# Patient Record
Sex: Male | Born: 1935
Health system: Southern US, Community
[De-identification: ages and names within clinical notes are randomized; demographics above are authoritative.]

## PROBLEM LIST (undated history)

## (undated) DIAGNOSIS — M109 Gout, unspecified: Secondary | ICD-10-CM

## (undated) DIAGNOSIS — Z8619 Personal history of other infectious and parasitic diseases: Secondary | ICD-10-CM

## (undated) DIAGNOSIS — R972 Elevated prostate specific antigen [PSA]: Secondary | ICD-10-CM

## (undated) DIAGNOSIS — H3530 Unspecified macular degeneration: Secondary | ICD-10-CM

## (undated) DIAGNOSIS — R011 Cardiac murmur, unspecified: Secondary | ICD-10-CM

## (undated) DIAGNOSIS — Z8669 Personal history of other diseases of the nervous system and sense organs: Secondary | ICD-10-CM

## (undated) DIAGNOSIS — I10 Essential (primary) hypertension: Secondary | ICD-10-CM

## (undated) DIAGNOSIS — Z8679 Personal history of other diseases of the circulatory system: Secondary | ICD-10-CM

## (undated) HISTORY — DX: Personal history of other diseases of the nervous system and sense organs: Z86.69

## (undated) HISTORY — DX: Personal history of other infectious and parasitic diseases: Z86.19

## (undated) HISTORY — PX: CATARACT EXTRACTION: SUR2

## (undated) HISTORY — PX: TONSILLECTOMY: SHX5217

## (undated) HISTORY — PX: REPLACEMENT TOTAL KNEE BILATERAL: SUR1225

## (undated) HISTORY — DX: Elevated prostate specific antigen (PSA): R97.20

## (undated) HISTORY — DX: Personal history of other diseases of the circulatory system: Z86.79

## (undated) HISTORY — DX: Gout, unspecified: M10.9

---

## 2008-07-13 HISTORY — PX: RADIOACTIVE SEED IMPLANT: SHX5150

## 2009-09-09 ENCOUNTER — Encounter: Admission: RE | Admit: 2009-09-09 | Discharge: 2009-09-09 | Payer: Self-pay | Admitting: Urology

## 2009-10-23 ENCOUNTER — Ambulatory Visit (HOSPITAL_BASED_OUTPATIENT_CLINIC_OR_DEPARTMENT_OTHER): Admission: RE | Admit: 2009-10-23 | Discharge: 2009-10-23 | Payer: Self-pay | Admitting: Urology

## 2009-10-25 ENCOUNTER — Ambulatory Visit: Admission: RE | Admit: 2009-10-25 | Discharge: 2009-12-06 | Payer: Self-pay | Admitting: Radiation Oncology

## 2010-07-23 IMAGING — CR DG CHEST 2V
3 series · 3 of 3 positions shown · non-contrast
Comparison: None.

CLINICAL DATA: Prostate cancer.

CHEST - 2 VIEW

[view not recorded (1 of 3)]
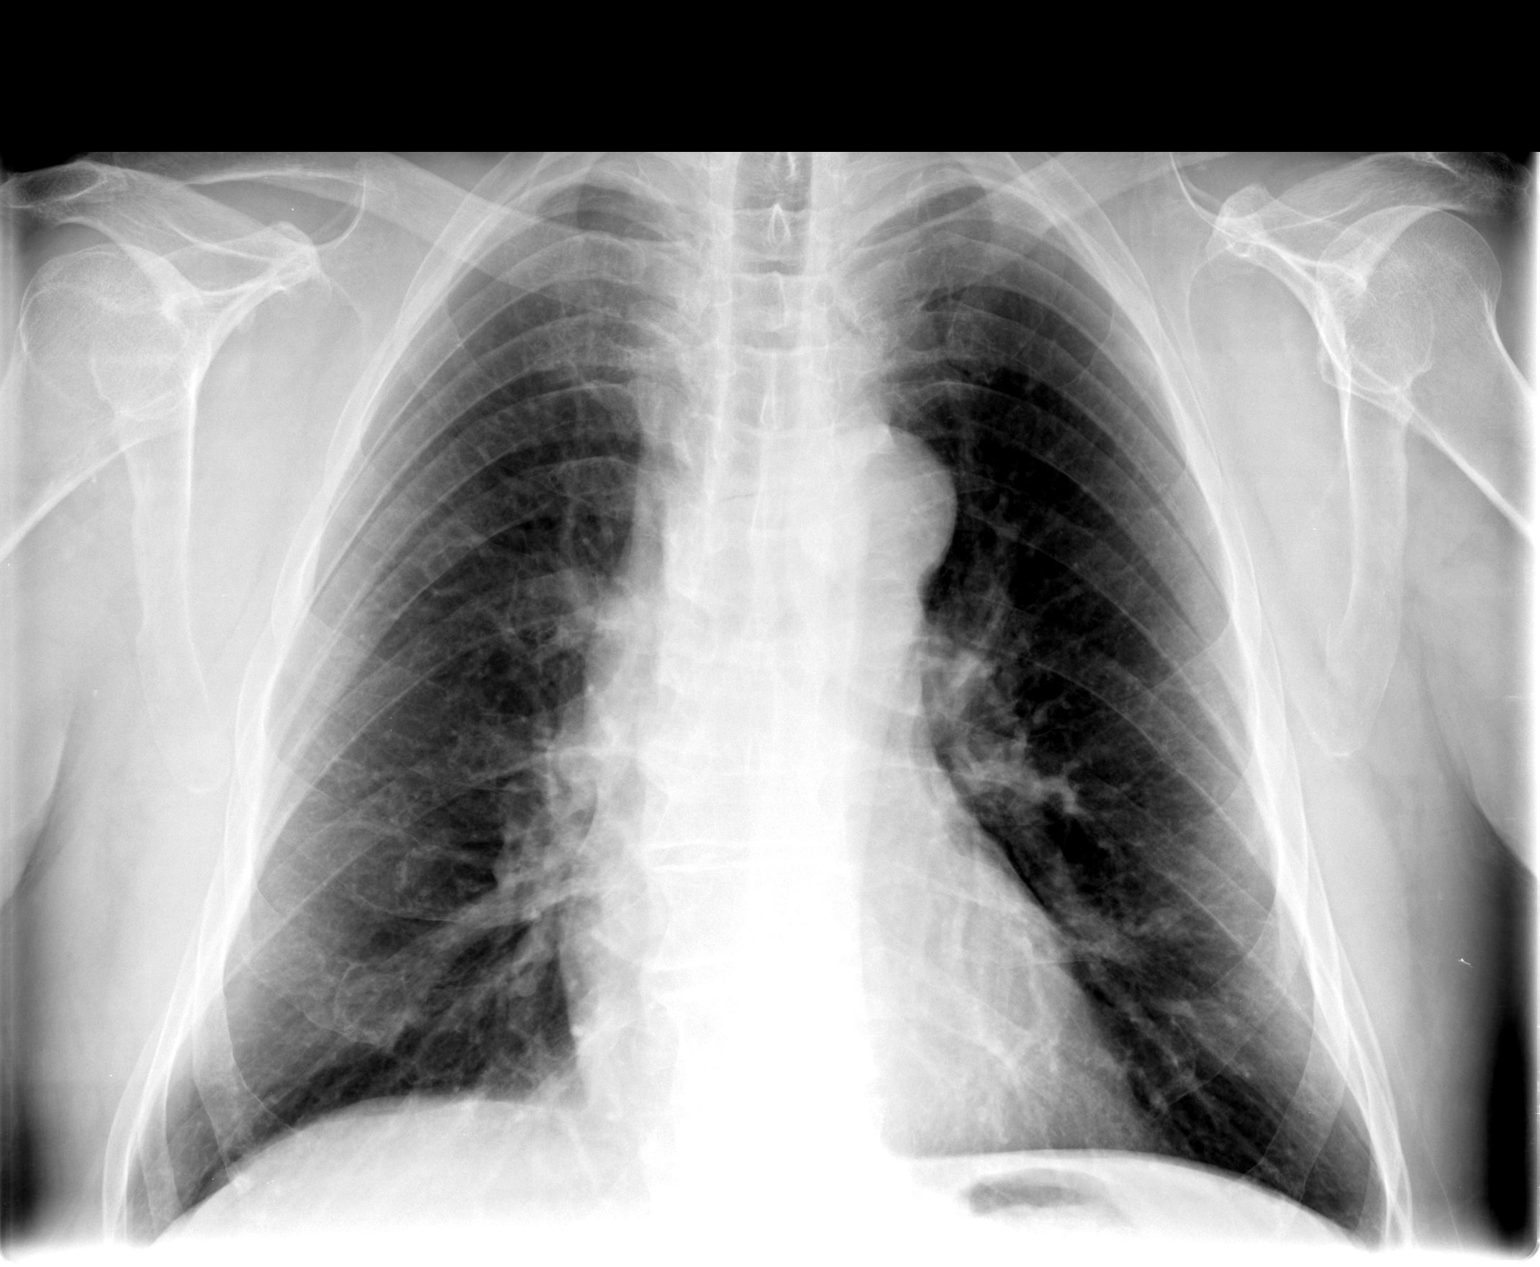

[view not recorded (2 of 3)]
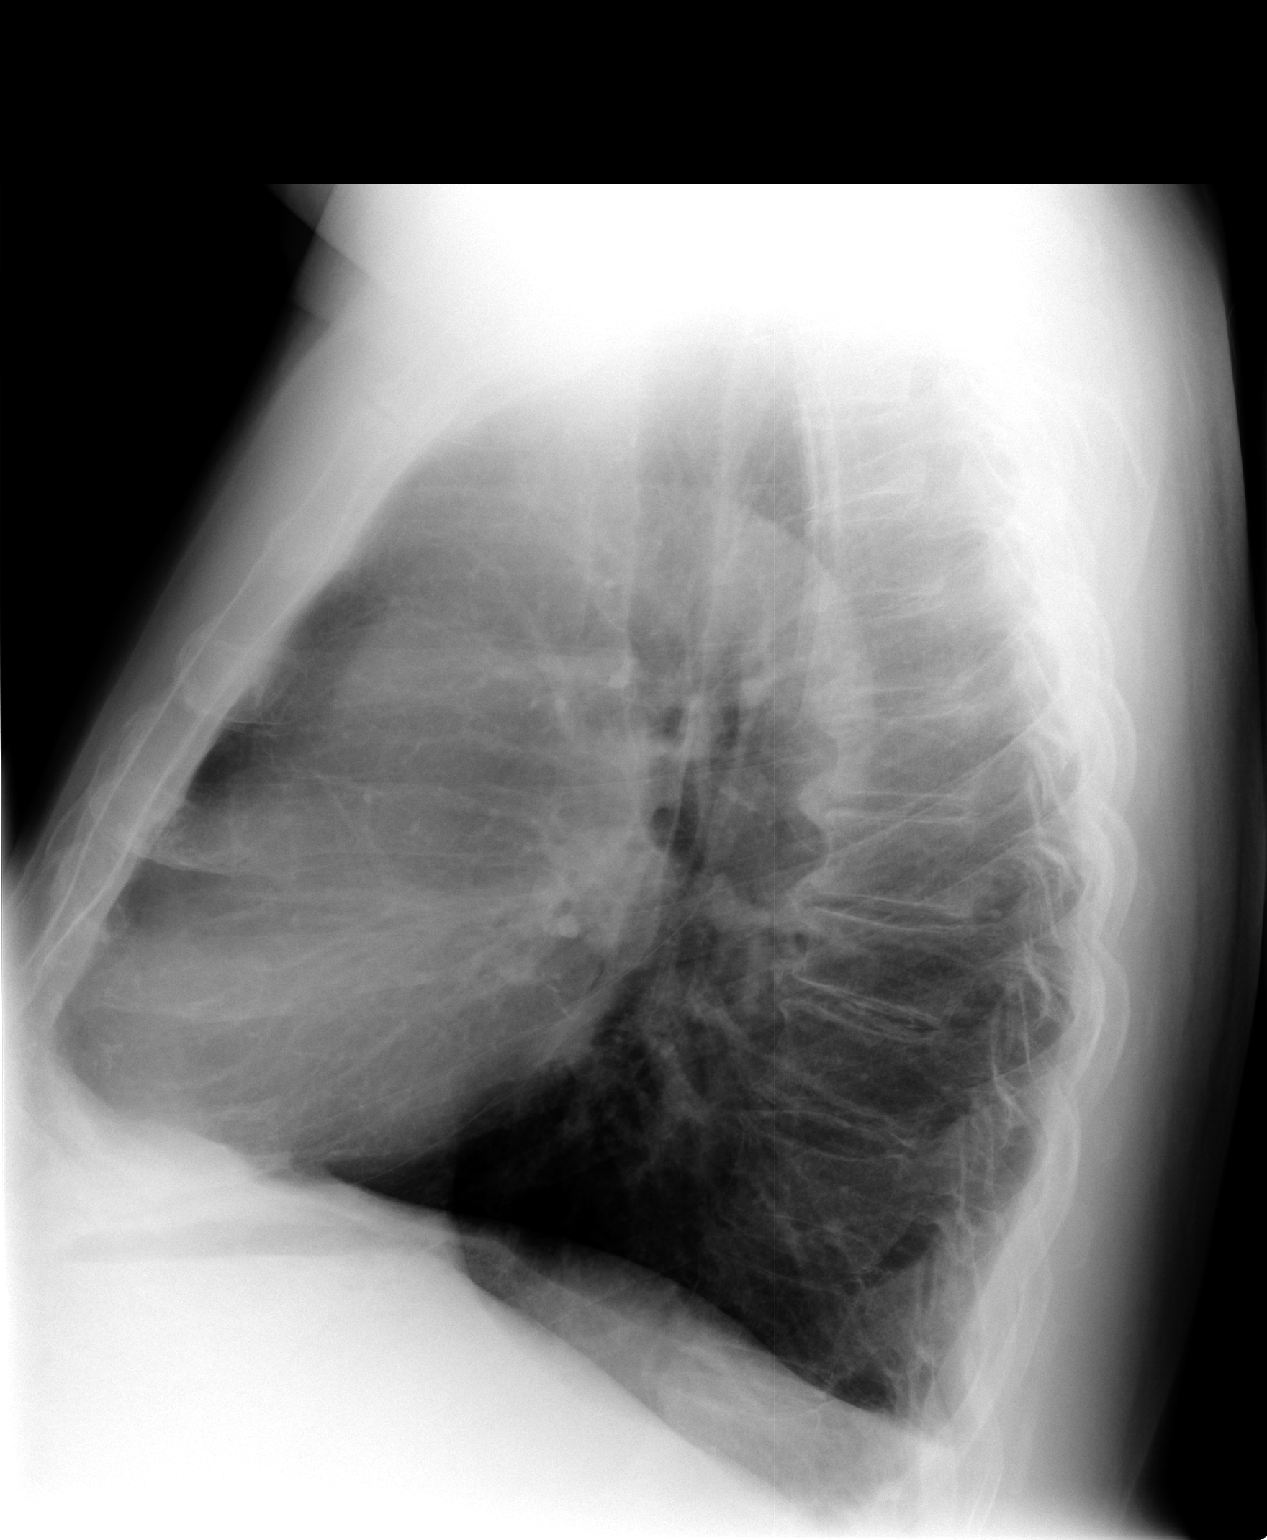

[view not recorded (3 of 3)]
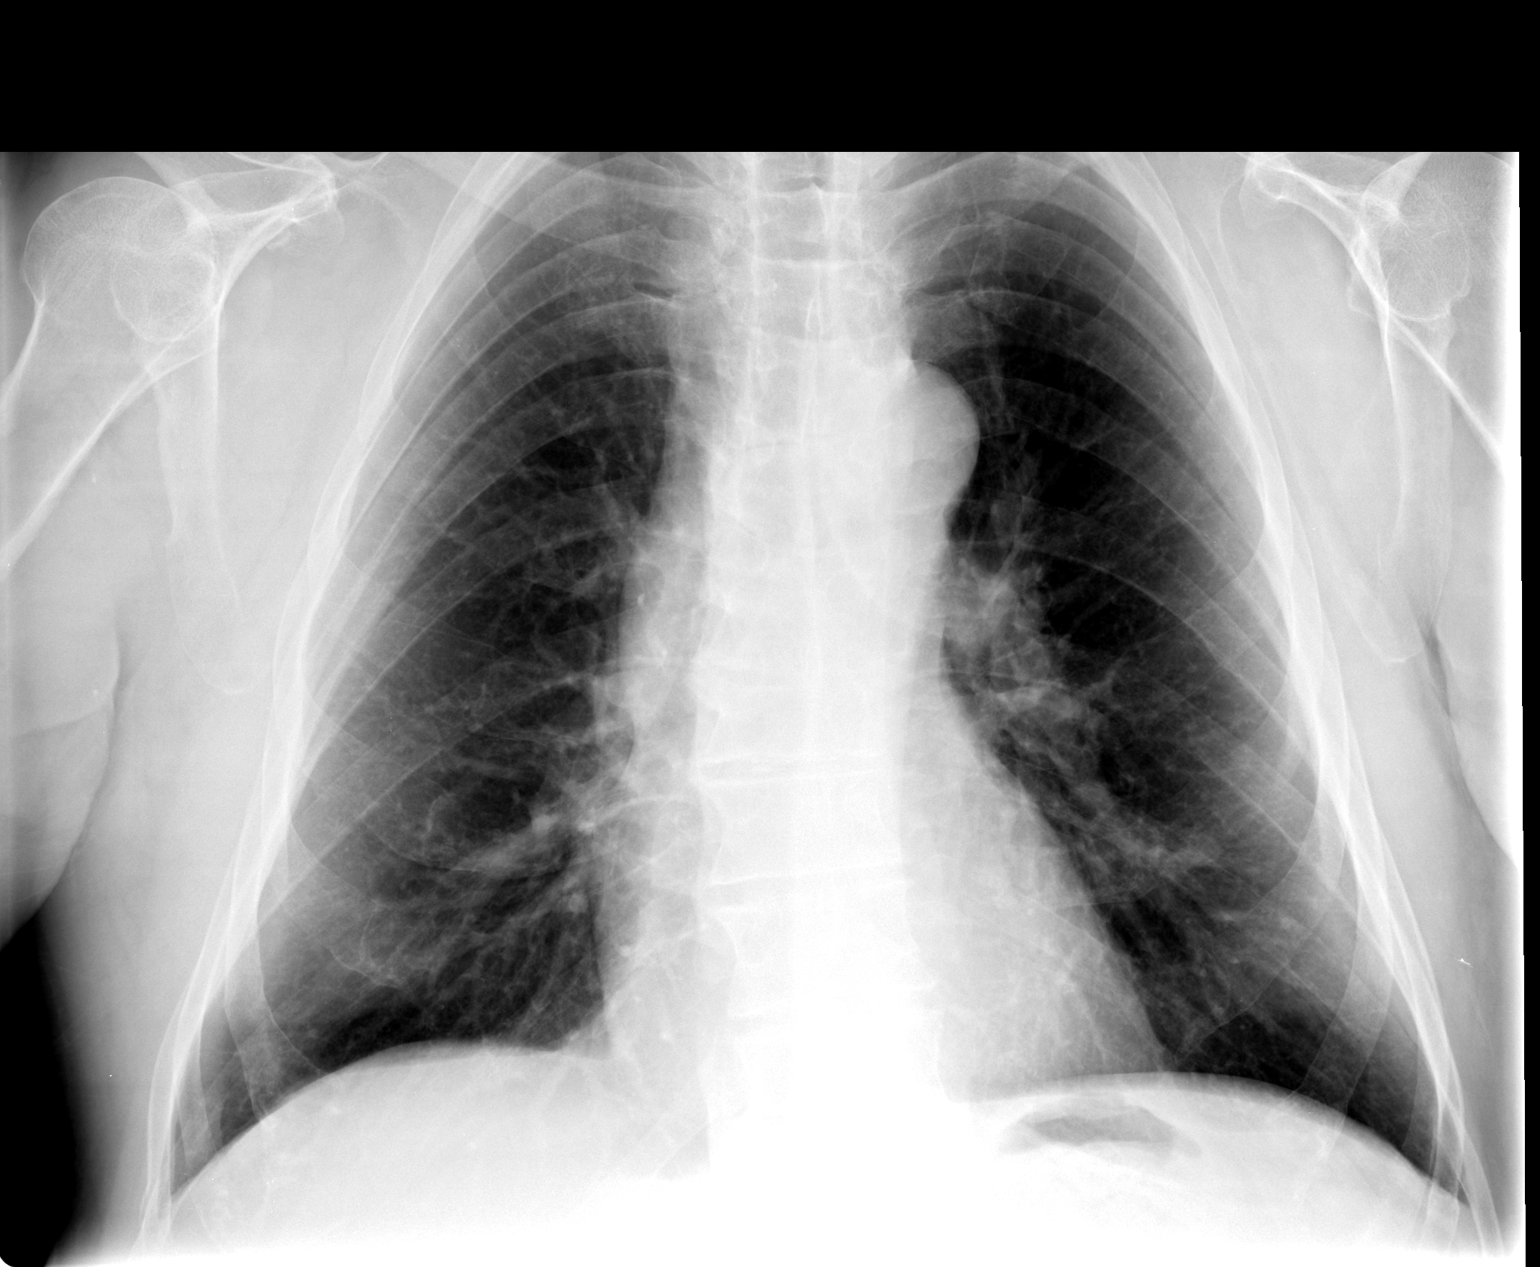

[3 of 3 positions shown; findings below may reference images not displayed]

FINDINGS: Trachea is midline.  Heart size normal.  Biapical pleural
thickening.  Lungs are clear.  Flattening of the hemidiaphragms.
No pleural fluid.  Degenerative changes in the spine.
IMPRESSION: COPD without acute finding.

## 2010-10-06 LAB — COMPREHENSIVE METABOLIC PANEL
ALT: 34 U/L (ref 0–53)
Alkaline Phosphatase: 60 U/L (ref 39–117)
BUN: 9 mg/dL (ref 6–23)
CO2: 30 mEq/L (ref 19–32)
Calcium: 9.5 mg/dL (ref 8.4–10.5)
Chloride: 103 mEq/L (ref 96–112)
Creatinine, Ser: 0.62 mg/dL (ref 0.4–1.5)
Potassium: 4.9 mEq/L (ref 3.5–5.1)
Sodium: 137 mEq/L (ref 135–145)
Total Bilirubin: 0.9 mg/dL (ref 0.3–1.2)

## 2010-10-06 LAB — CBC
MCHC: 32.7 g/dL (ref 30.0–36.0)
RDW: 14.4 % (ref 11.5–15.5)
WBC: 6.8 10*3/uL (ref 4.0–10.5)

## 2010-10-06 LAB — APTT: aPTT: 30 seconds (ref 24–37)

## 2010-10-06 LAB — PROTIME-INR
INR: 1.04 (ref 0.00–1.49)
Prothrombin Time: 13.5 seconds (ref 11.6–15.2)

## 2015-07-24 DIAGNOSIS — M1990 Unspecified osteoarthritis, unspecified site: Secondary | ICD-10-CM | POA: Insufficient documentation

## 2015-07-24 DIAGNOSIS — D1779 Benign lipomatous neoplasm of other sites: Secondary | ICD-10-CM | POA: Insufficient documentation

## 2015-07-24 DIAGNOSIS — B351 Tinea unguium: Secondary | ICD-10-CM

## 2015-07-24 DIAGNOSIS — C61 Malignant neoplasm of prostate: Secondary | ICD-10-CM | POA: Insufficient documentation

## 2015-07-24 DIAGNOSIS — D126 Benign neoplasm of colon, unspecified: Secondary | ICD-10-CM

## 2015-07-24 DIAGNOSIS — G4730 Sleep apnea, unspecified: Secondary | ICD-10-CM | POA: Insufficient documentation

## 2015-07-24 DIAGNOSIS — E6609 Other obesity due to excess calories: Secondary | ICD-10-CM | POA: Insufficient documentation

## 2015-07-24 DIAGNOSIS — H409 Unspecified glaucoma: Secondary | ICD-10-CM | POA: Insufficient documentation

## 2015-07-24 HISTORY — DX: Sleep apnea, unspecified: G47.30

## 2015-07-24 HISTORY — DX: Unspecified osteoarthritis, unspecified site: M19.90

## 2015-07-24 HISTORY — DX: Benign neoplasm of colon, unspecified: D12.6

## 2015-07-24 HISTORY — DX: Tinea unguium: B35.1

## 2015-07-24 HISTORY — DX: Malignant neoplasm of prostate: C61

## 2015-08-13 DIAGNOSIS — Z79899 Other long term (current) drug therapy: Secondary | ICD-10-CM | POA: Diagnosis not present

## 2015-08-13 DIAGNOSIS — E781 Pure hyperglyceridemia: Secondary | ICD-10-CM | POA: Diagnosis not present

## 2015-08-13 DIAGNOSIS — Z0000 Encounter for general adult medical examination without abnormal findings: Secondary | ICD-10-CM | POA: Diagnosis not present

## 2015-08-13 DIAGNOSIS — G4733 Obstructive sleep apnea (adult) (pediatric): Secondary | ICD-10-CM | POA: Diagnosis not present

## 2015-08-13 DIAGNOSIS — B351 Tinea unguium: Secondary | ICD-10-CM | POA: Diagnosis not present

## 2015-08-13 DIAGNOSIS — M1991 Primary osteoarthritis, unspecified site: Secondary | ICD-10-CM | POA: Diagnosis not present

## 2015-08-13 DIAGNOSIS — Z6831 Body mass index (BMI) 31.0-31.9, adult: Secondary | ICD-10-CM | POA: Diagnosis not present

## 2015-08-13 DIAGNOSIS — D126 Benign neoplasm of colon, unspecified: Secondary | ICD-10-CM | POA: Diagnosis not present

## 2015-08-13 DIAGNOSIS — E6609 Other obesity due to excess calories: Secondary | ICD-10-CM | POA: Diagnosis not present

## 2015-08-13 DIAGNOSIS — C61 Malignant neoplasm of prostate: Secondary | ICD-10-CM | POA: Diagnosis not present

## 2015-09-19 DIAGNOSIS — D126 Benign neoplasm of colon, unspecified: Secondary | ICD-10-CM | POA: Diagnosis not present

## 2015-09-19 DIAGNOSIS — K5900 Constipation, unspecified: Secondary | ICD-10-CM | POA: Diagnosis not present

## 2015-09-19 DIAGNOSIS — K5730 Diverticulosis of large intestine without perforation or abscess without bleeding: Secondary | ICD-10-CM | POA: Diagnosis not present

## 2015-10-08 DIAGNOSIS — Z79899 Other long term (current) drug therapy: Secondary | ICD-10-CM | POA: Diagnosis not present

## 2015-10-08 DIAGNOSIS — K5730 Diverticulosis of large intestine without perforation or abscess without bleeding: Secondary | ICD-10-CM | POA: Diagnosis not present

## 2015-10-08 DIAGNOSIS — K6389 Other specified diseases of intestine: Secondary | ICD-10-CM | POA: Diagnosis not present

## 2015-10-08 DIAGNOSIS — Z85828 Personal history of other malignant neoplasm of skin: Secondary | ICD-10-CM | POA: Diagnosis not present

## 2015-10-08 DIAGNOSIS — Z86010 Personal history of colonic polyps: Secondary | ICD-10-CM | POA: Diagnosis not present

## 2015-10-08 DIAGNOSIS — Z1211 Encounter for screening for malignant neoplasm of colon: Secondary | ICD-10-CM | POA: Diagnosis not present

## 2015-10-08 DIAGNOSIS — Z8546 Personal history of malignant neoplasm of prostate: Secondary | ICD-10-CM | POA: Diagnosis not present

## 2015-10-08 DIAGNOSIS — I10 Essential (primary) hypertension: Secondary | ICD-10-CM | POA: Diagnosis not present

## 2015-11-12 DIAGNOSIS — Z9842 Cataract extraction status, left eye: Secondary | ICD-10-CM | POA: Diagnosis not present

## 2015-11-12 DIAGNOSIS — H353131 Nonexudative age-related macular degeneration, bilateral, early dry stage: Secondary | ICD-10-CM | POA: Diagnosis not present

## 2015-11-12 DIAGNOSIS — H47233 Glaucomatous optic atrophy, bilateral: Secondary | ICD-10-CM | POA: Diagnosis not present

## 2015-11-12 DIAGNOSIS — H524 Presbyopia: Secondary | ICD-10-CM | POA: Diagnosis not present

## 2015-11-12 DIAGNOSIS — H401131 Primary open-angle glaucoma, bilateral, mild stage: Secondary | ICD-10-CM | POA: Diagnosis not present

## 2015-11-12 DIAGNOSIS — H52223 Regular astigmatism, bilateral: Secondary | ICD-10-CM | POA: Diagnosis not present

## 2015-11-12 DIAGNOSIS — Z9841 Cataract extraction status, right eye: Secondary | ICD-10-CM | POA: Diagnosis not present

## 2015-11-12 DIAGNOSIS — H5203 Hypermetropia, bilateral: Secondary | ICD-10-CM | POA: Diagnosis not present

## 2015-11-12 DIAGNOSIS — Z961 Presence of intraocular lens: Secondary | ICD-10-CM | POA: Diagnosis not present

## 2015-11-25 DIAGNOSIS — H401132 Primary open-angle glaucoma, bilateral, moderate stage: Secondary | ICD-10-CM | POA: Diagnosis not present

## 2015-11-25 DIAGNOSIS — H47233 Glaucomatous optic atrophy, bilateral: Secondary | ICD-10-CM | POA: Diagnosis not present

## 2015-12-23 DIAGNOSIS — D485 Neoplasm of uncertain behavior of skin: Secondary | ICD-10-CM | POA: Diagnosis not present

## 2015-12-23 DIAGNOSIS — L820 Inflamed seborrheic keratosis: Secondary | ICD-10-CM | POA: Diagnosis not present

## 2016-03-03 DIAGNOSIS — H401132 Primary open-angle glaucoma, bilateral, moderate stage: Secondary | ICD-10-CM | POA: Diagnosis not present

## 2016-03-03 DIAGNOSIS — H47233 Glaucomatous optic atrophy, bilateral: Secondary | ICD-10-CM | POA: Diagnosis not present

## 2016-04-13 DIAGNOSIS — Z23 Encounter for immunization: Secondary | ICD-10-CM | POA: Diagnosis not present

## 2016-06-02 DIAGNOSIS — H47233 Glaucomatous optic atrophy, bilateral: Secondary | ICD-10-CM | POA: Diagnosis not present

## 2016-06-02 DIAGNOSIS — H401132 Primary open-angle glaucoma, bilateral, moderate stage: Secondary | ICD-10-CM | POA: Diagnosis not present

## 2016-08-21 DIAGNOSIS — C61 Malignant neoplasm of prostate: Secondary | ICD-10-CM | POA: Diagnosis not present

## 2016-08-21 DIAGNOSIS — E669 Obesity, unspecified: Secondary | ICD-10-CM | POA: Diagnosis not present

## 2016-08-21 DIAGNOSIS — Z6830 Body mass index (BMI) 30.0-30.9, adult: Secondary | ICD-10-CM | POA: Diagnosis not present

## 2016-08-21 DIAGNOSIS — M1991 Primary osteoarthritis, unspecified site: Secondary | ICD-10-CM | POA: Diagnosis not present

## 2016-08-21 DIAGNOSIS — K439 Ventral hernia without obstruction or gangrene: Secondary | ICD-10-CM

## 2016-08-21 DIAGNOSIS — E781 Pure hyperglyceridemia: Secondary | ICD-10-CM | POA: Diagnosis not present

## 2016-08-21 DIAGNOSIS — D126 Benign neoplasm of colon, unspecified: Secondary | ICD-10-CM | POA: Diagnosis not present

## 2016-08-21 DIAGNOSIS — Z79899 Other long term (current) drug therapy: Secondary | ICD-10-CM | POA: Diagnosis not present

## 2016-08-21 DIAGNOSIS — G4733 Obstructive sleep apnea (adult) (pediatric): Secondary | ICD-10-CM | POA: Diagnosis not present

## 2016-08-21 DIAGNOSIS — Z0000 Encounter for general adult medical examination without abnormal findings: Secondary | ICD-10-CM | POA: Diagnosis not present

## 2016-08-21 HISTORY — DX: Ventral hernia without obstruction or gangrene: K43.9

## 2016-09-03 DIAGNOSIS — H401132 Primary open-angle glaucoma, bilateral, moderate stage: Secondary | ICD-10-CM | POA: Diagnosis not present

## 2016-09-03 DIAGNOSIS — H353131 Nonexudative age-related macular degeneration, bilateral, early dry stage: Secondary | ICD-10-CM | POA: Diagnosis not present

## 2016-09-03 DIAGNOSIS — H47233 Glaucomatous optic atrophy, bilateral: Secondary | ICD-10-CM | POA: Diagnosis not present

## 2016-09-15 DIAGNOSIS — N529 Male erectile dysfunction, unspecified: Secondary | ICD-10-CM | POA: Diagnosis not present

## 2016-09-15 DIAGNOSIS — C61 Malignant neoplasm of prostate: Secondary | ICD-10-CM | POA: Diagnosis not present

## 2017-01-05 DIAGNOSIS — H353131 Nonexudative age-related macular degeneration, bilateral, early dry stage: Secondary | ICD-10-CM | POA: Diagnosis not present

## 2017-01-05 DIAGNOSIS — Z9842 Cataract extraction status, left eye: Secondary | ICD-10-CM | POA: Diagnosis not present

## 2017-01-05 DIAGNOSIS — H52223 Regular astigmatism, bilateral: Secondary | ICD-10-CM | POA: Diagnosis not present

## 2017-01-05 DIAGNOSIS — Z961 Presence of intraocular lens: Secondary | ICD-10-CM | POA: Diagnosis not present

## 2017-01-05 DIAGNOSIS — H401132 Primary open-angle glaucoma, bilateral, moderate stage: Secondary | ICD-10-CM | POA: Diagnosis not present

## 2017-01-05 DIAGNOSIS — H524 Presbyopia: Secondary | ICD-10-CM | POA: Diagnosis not present

## 2017-01-05 DIAGNOSIS — H5203 Hypermetropia, bilateral: Secondary | ICD-10-CM | POA: Diagnosis not present

## 2017-01-05 DIAGNOSIS — H47233 Glaucomatous optic atrophy, bilateral: Secondary | ICD-10-CM | POA: Diagnosis not present

## 2017-01-05 DIAGNOSIS — Z9841 Cataract extraction status, right eye: Secondary | ICD-10-CM | POA: Diagnosis not present

## 2017-03-25 DIAGNOSIS — C61 Malignant neoplasm of prostate: Secondary | ICD-10-CM | POA: Diagnosis not present

## 2017-03-25 DIAGNOSIS — Z7689 Persons encountering health services in other specified circumstances: Secondary | ICD-10-CM | POA: Diagnosis not present

## 2017-04-06 DIAGNOSIS — H52223 Regular astigmatism, bilateral: Secondary | ICD-10-CM | POA: Diagnosis not present

## 2017-04-06 DIAGNOSIS — H524 Presbyopia: Secondary | ICD-10-CM | POA: Diagnosis not present

## 2017-04-06 DIAGNOSIS — Z23 Encounter for immunization: Secondary | ICD-10-CM | POA: Diagnosis not present

## 2017-04-06 DIAGNOSIS — H47233 Glaucomatous optic atrophy, bilateral: Secondary | ICD-10-CM | POA: Diagnosis not present

## 2017-04-06 DIAGNOSIS — H5203 Hypermetropia, bilateral: Secondary | ICD-10-CM | POA: Diagnosis not present

## 2017-04-06 DIAGNOSIS — H401132 Primary open-angle glaucoma, bilateral, moderate stage: Secondary | ICD-10-CM | POA: Diagnosis not present

## 2017-09-23 DIAGNOSIS — H409 Unspecified glaucoma: Secondary | ICD-10-CM | POA: Diagnosis not present

## 2017-09-23 DIAGNOSIS — M1991 Primary osteoarthritis, unspecified site: Secondary | ICD-10-CM | POA: Diagnosis not present

## 2017-09-23 DIAGNOSIS — E781 Pure hyperglyceridemia: Secondary | ICD-10-CM | POA: Diagnosis not present

## 2017-09-23 DIAGNOSIS — R351 Nocturia: Secondary | ICD-10-CM | POA: Diagnosis not present

## 2017-09-23 DIAGNOSIS — Z86010 Personal history of colonic polyps: Secondary | ICD-10-CM | POA: Diagnosis not present

## 2017-09-23 DIAGNOSIS — C61 Malignant neoplasm of prostate: Secondary | ICD-10-CM | POA: Diagnosis not present

## 2017-09-23 DIAGNOSIS — Z0000 Encounter for general adult medical examination without abnormal findings: Secondary | ICD-10-CM | POA: Diagnosis not present

## 2017-10-21 DIAGNOSIS — D485 Neoplasm of uncertain behavior of skin: Secondary | ICD-10-CM | POA: Diagnosis not present

## 2017-10-21 DIAGNOSIS — L821 Other seborrheic keratosis: Secondary | ICD-10-CM | POA: Diagnosis not present

## 2017-10-21 DIAGNOSIS — D225 Melanocytic nevi of trunk: Secondary | ICD-10-CM | POA: Diagnosis not present

## 2017-10-21 DIAGNOSIS — D1801 Hemangioma of skin and subcutaneous tissue: Secondary | ICD-10-CM | POA: Diagnosis not present

## 2018-02-04 DIAGNOSIS — H401131 Primary open-angle glaucoma, bilateral, mild stage: Secondary | ICD-10-CM | POA: Diagnosis not present

## 2018-03-29 DIAGNOSIS — C61 Malignant neoplasm of prostate: Secondary | ICD-10-CM | POA: Diagnosis not present

## 2018-03-29 DIAGNOSIS — Z7689 Persons encountering health services in other specified circumstances: Secondary | ICD-10-CM | POA: Diagnosis not present

## 2018-03-29 DIAGNOSIS — R351 Nocturia: Secondary | ICD-10-CM | POA: Diagnosis not present

## 2018-04-08 DIAGNOSIS — Z23 Encounter for immunization: Secondary | ICD-10-CM | POA: Diagnosis not present

## 2018-09-13 DIAGNOSIS — H40013 Open angle with borderline findings, low risk, bilateral: Secondary | ICD-10-CM | POA: Diagnosis not present

## 2018-09-26 DIAGNOSIS — C61 Malignant neoplasm of prostate: Secondary | ICD-10-CM | POA: Diagnosis not present

## 2018-09-26 DIAGNOSIS — G4733 Obstructive sleep apnea (adult) (pediatric): Secondary | ICD-10-CM | POA: Diagnosis not present

## 2018-09-26 DIAGNOSIS — Z79899 Other long term (current) drug therapy: Secondary | ICD-10-CM | POA: Diagnosis not present

## 2018-09-26 DIAGNOSIS — M1991 Primary osteoarthritis, unspecified site: Secondary | ICD-10-CM | POA: Diagnosis not present

## 2018-09-26 DIAGNOSIS — Z6831 Body mass index (BMI) 31.0-31.9, adult: Secondary | ICD-10-CM | POA: Diagnosis not present

## 2018-09-26 DIAGNOSIS — E6609 Other obesity due to excess calories: Secondary | ICD-10-CM | POA: Diagnosis not present

## 2018-09-26 DIAGNOSIS — D126 Benign neoplasm of colon, unspecified: Secondary | ICD-10-CM | POA: Diagnosis not present

## 2018-09-26 DIAGNOSIS — R030 Elevated blood-pressure reading, without diagnosis of hypertension: Secondary | ICD-10-CM | POA: Diagnosis not present

## 2018-09-26 DIAGNOSIS — Z0000 Encounter for general adult medical examination without abnormal findings: Secondary | ICD-10-CM | POA: Diagnosis not present

## 2018-09-27 DIAGNOSIS — C61 Malignant neoplasm of prostate: Secondary | ICD-10-CM | POA: Diagnosis not present

## 2018-09-27 DIAGNOSIS — R351 Nocturia: Secondary | ICD-10-CM | POA: Diagnosis not present

## 2018-10-27 DIAGNOSIS — L820 Inflamed seborrheic keratosis: Secondary | ICD-10-CM | POA: Diagnosis not present

## 2018-10-27 DIAGNOSIS — D1801 Hemangioma of skin and subcutaneous tissue: Secondary | ICD-10-CM | POA: Diagnosis not present

## 2018-10-27 DIAGNOSIS — L821 Other seborrheic keratosis: Secondary | ICD-10-CM | POA: Diagnosis not present

## 2018-10-27 DIAGNOSIS — L918 Other hypertrophic disorders of the skin: Secondary | ICD-10-CM | POA: Diagnosis not present

## 2019-03-14 DIAGNOSIS — H40013 Open angle with borderline findings, low risk, bilateral: Secondary | ICD-10-CM | POA: Diagnosis not present

## 2019-04-06 DIAGNOSIS — Z23 Encounter for immunization: Secondary | ICD-10-CM | POA: Diagnosis not present

## 2019-04-06 DIAGNOSIS — C61 Malignant neoplasm of prostate: Secondary | ICD-10-CM | POA: Diagnosis not present

## 2019-04-15 DIAGNOSIS — L814 Other melanin hyperpigmentation: Secondary | ICD-10-CM | POA: Diagnosis not present

## 2019-04-15 DIAGNOSIS — D1801 Hemangioma of skin and subcutaneous tissue: Secondary | ICD-10-CM | POA: Diagnosis not present

## 2019-04-15 DIAGNOSIS — L570 Actinic keratosis: Secondary | ICD-10-CM | POA: Diagnosis not present

## 2019-04-15 DIAGNOSIS — L821 Other seborrheic keratosis: Secondary | ICD-10-CM | POA: Diagnosis not present

## 2019-05-22 DIAGNOSIS — R1032 Left lower quadrant pain: Secondary | ICD-10-CM | POA: Diagnosis not present

## 2019-05-22 DIAGNOSIS — C61 Malignant neoplasm of prostate: Secondary | ICD-10-CM | POA: Diagnosis not present

## 2019-05-22 DIAGNOSIS — R109 Unspecified abdominal pain: Secondary | ICD-10-CM | POA: Diagnosis not present

## 2019-05-23 DIAGNOSIS — R1032 Left lower quadrant pain: Secondary | ICD-10-CM | POA: Diagnosis not present

## 2019-05-23 DIAGNOSIS — S32030A Wedge compression fracture of third lumbar vertebra, initial encounter for closed fracture: Secondary | ICD-10-CM | POA: Diagnosis not present

## 2019-05-23 DIAGNOSIS — R109 Unspecified abdominal pain: Secondary | ICD-10-CM | POA: Diagnosis not present

## 2019-05-24 DIAGNOSIS — S32030D Wedge compression fracture of third lumbar vertebra, subsequent encounter for fracture with routine healing: Secondary | ICD-10-CM

## 2019-05-24 HISTORY — DX: Wedge compression fracture of third lumbar vertebra, subsequent encounter for fracture with routine healing: S32.030D

## 2019-09-28 DIAGNOSIS — R030 Elevated blood-pressure reading, without diagnosis of hypertension: Secondary | ICD-10-CM | POA: Diagnosis not present

## 2019-09-28 DIAGNOSIS — C61 Malignant neoplasm of prostate: Secondary | ICD-10-CM | POA: Diagnosis not present

## 2019-09-28 DIAGNOSIS — R7303 Prediabetes: Secondary | ICD-10-CM | POA: Diagnosis not present

## 2019-09-28 DIAGNOSIS — Z0000 Encounter for general adult medical examination without abnormal findings: Secondary | ICD-10-CM | POA: Diagnosis not present

## 2019-09-28 DIAGNOSIS — G4733 Obstructive sleep apnea (adult) (pediatric): Secondary | ICD-10-CM | POA: Diagnosis not present

## 2019-09-28 DIAGNOSIS — E782 Mixed hyperlipidemia: Secondary | ICD-10-CM

## 2019-09-28 DIAGNOSIS — S32030D Wedge compression fracture of third lumbar vertebra, subsequent encounter for fracture with routine healing: Secondary | ICD-10-CM | POA: Diagnosis not present

## 2019-09-28 DIAGNOSIS — M1991 Primary osteoarthritis, unspecified site: Secondary | ICD-10-CM | POA: Diagnosis not present

## 2019-09-28 DIAGNOSIS — E6609 Other obesity due to excess calories: Secondary | ICD-10-CM | POA: Diagnosis not present

## 2019-09-28 DIAGNOSIS — D126 Benign neoplasm of colon, unspecified: Secondary | ICD-10-CM | POA: Diagnosis not present

## 2019-09-28 DIAGNOSIS — Z6830 Body mass index (BMI) 30.0-30.9, adult: Secondary | ICD-10-CM | POA: Diagnosis not present

## 2019-09-28 HISTORY — DX: Mixed hyperlipidemia: E78.2

## 2019-09-29 DIAGNOSIS — H40013 Open angle with borderline findings, low risk, bilateral: Secondary | ICD-10-CM | POA: Diagnosis not present

## 2019-10-05 DIAGNOSIS — C61 Malignant neoplasm of prostate: Secondary | ICD-10-CM | POA: Diagnosis not present

## 2019-10-05 DIAGNOSIS — R351 Nocturia: Secondary | ICD-10-CM | POA: Diagnosis not present

## 2020-01-25 DIAGNOSIS — D225 Melanocytic nevi of trunk: Secondary | ICD-10-CM | POA: Diagnosis not present

## 2020-01-25 DIAGNOSIS — D1801 Hemangioma of skin and subcutaneous tissue: Secondary | ICD-10-CM | POA: Diagnosis not present

## 2020-01-25 DIAGNOSIS — L570 Actinic keratosis: Secondary | ICD-10-CM | POA: Diagnosis not present

## 2020-02-14 DIAGNOSIS — R030 Elevated blood-pressure reading, without diagnosis of hypertension: Secondary | ICD-10-CM | POA: Diagnosis not present

## 2020-02-14 DIAGNOSIS — M1991 Primary osteoarthritis, unspecified site: Secondary | ICD-10-CM | POA: Diagnosis not present

## 2020-02-27 DIAGNOSIS — M1712 Unilateral primary osteoarthritis, left knee: Secondary | ICD-10-CM | POA: Diagnosis not present

## 2020-02-27 DIAGNOSIS — M25462 Effusion, left knee: Secondary | ICD-10-CM | POA: Diagnosis not present

## 2020-02-27 DIAGNOSIS — W19XXXA Unspecified fall, initial encounter: Secondary | ICD-10-CM | POA: Diagnosis not present

## 2020-02-27 DIAGNOSIS — Z96652 Presence of left artificial knee joint: Secondary | ICD-10-CM | POA: Diagnosis not present

## 2020-02-27 DIAGNOSIS — Z96642 Presence of left artificial hip joint: Secondary | ICD-10-CM | POA: Diagnosis not present

## 2020-02-27 DIAGNOSIS — R102 Pelvic and perineal pain: Secondary | ICD-10-CM | POA: Diagnosis not present

## 2020-02-27 DIAGNOSIS — M25552 Pain in left hip: Secondary | ICD-10-CM | POA: Diagnosis not present

## 2020-02-27 DIAGNOSIS — Z01818 Encounter for other preprocedural examination: Secondary | ICD-10-CM | POA: Diagnosis not present

## 2020-02-27 DIAGNOSIS — R9431 Abnormal electrocardiogram [ECG] [EKG]: Secondary | ICD-10-CM | POA: Diagnosis not present

## 2020-02-27 DIAGNOSIS — R030 Elevated blood-pressure reading, without diagnosis of hypertension: Secondary | ICD-10-CM | POA: Diagnosis not present

## 2020-03-08 ENCOUNTER — Other Ambulatory Visit: Payer: Self-pay

## 2020-03-08 DIAGNOSIS — Z8619 Personal history of other infectious and parasitic diseases: Secondary | ICD-10-CM | POA: Insufficient documentation

## 2020-03-08 DIAGNOSIS — Z8669 Personal history of other diseases of the nervous system and sense organs: Secondary | ICD-10-CM | POA: Insufficient documentation

## 2020-03-08 DIAGNOSIS — Z8679 Personal history of other diseases of the circulatory system: Secondary | ICD-10-CM | POA: Insufficient documentation

## 2020-03-08 DIAGNOSIS — M109 Gout, unspecified: Secondary | ICD-10-CM | POA: Insufficient documentation

## 2020-03-08 DIAGNOSIS — R972 Elevated prostate specific antigen [PSA]: Secondary | ICD-10-CM | POA: Insufficient documentation

## 2020-03-10 NOTE — Progress Notes (Signed)
Cardiology Office Note:    Date:  03/11/2020   ID:  Austin Ayala, DOB [birth year], MRN [id number]  PCP:  Raina Mina., MD  Cardiologist:  Shirlee More, MD   Referring MD: Raina Mina., MD  ASSESSMENT:    1. Preoperative cardiovascular examination   2. Elevated blood pressure reading   3. Nonspecific abnormal electrocardiogram (ECG) (EKG)    PLAN:    In order of problems listed above:  1. His planned procedure is intermediate risk.  Patient's exercise tolerance is preserved his EKG is a pattern of LVH and repolarization.  At this time I do not think he requires preoperative stress testing and in general revascularization these individuals does not improve the operative outcome.  I would like him to start antihypertensive therapy I placed him on Cozaar in preparation for surgery and need to go a monitored bed postoperatively EKG postoperative day 1 and if any problems please call heart care to come and see him. 2. My opinion he has established hypertension with LVH and repolarization on EKG we will start antihypertensive therapy.  Next appointment as needed   Medication Adjustments/Labs and Tests Ordered: Current medicines are reviewed at length with the patient today.  Concerns regarding medicines are outlined above.  Orders Placed This Encounter  Procedures  . EKG 12-Lead   Meds ordered this encounter  Medications  . losartan (COZAAR) 50 MG tablet    Sig: Take 1 tablet (50 mg total) by mouth daily.    Dispense:  90 tablet    Refill:  3     No chief complaint on file.   History of Present Illness:    Austin Ayala is a 85 y.o. male who is being seen today for the evaluation of abnormal EKG in the preoperative setting prior to left total knee arthroplasty at the request of Raina Mina., MD.  I am unable to visually review the EKG in Care Everywhere the report shows that the rhythm was sinus bradycardia 59 bpm described as nonspecific ST-T changes and unchanged  from 02/27/2020.  Outpatient labs 09/28/2019 [facility] showed TSH of 0.00 CMP was normal except for glucose of 105 renal function was normal GFR 85 cc liver function normal.  TSH normal 3.84 and lipid profile showed a total cholesterol 191 LDL 133 triglyceride 186 HDL 34.  He reminds me I had seen him in 2013 for the same reason nonspecific EKG changes.  Sounds like he had an echocardiogram done and was cleared for surgery.  He has no history of coronary artery disease.  Despite his knee pain with previous left total knee arthroplasty [facility] and exercises on a treadmill bike works in his garden his exercise tolerance exceeds 5 Mets.  He has no history of congenital coronary rheumatic heart disease no chest pain palpitation edema or shortness of breath.  In my office today is hypertensive and rechecked by me left upper extremity 172/70.  In retrospect his EKG showed the beginning of repolarization changes he has hypertension I am going to start him on a low-dose of an ARB in preparation for surgery.  Past Medical History:  Diagnosis Date  . Benign neoplasm of colon 07/24/2015   Formatting of this note might be different from the original. Colon 2014. Stopping.  . Closed wedge compression fracture of L3 vertebra with routine healing 05/24/2019   Formatting of this note might be different from the original. Per pt message 05/24/19  .  Elevated prostate specific antigen (PSA)   . Gout, arthropathy   . H/O carpal tunnel syndrome   . H/O herpes zoster   . H/O: hypertension   . Malignant neoplasm prostate ([place]) 07/24/2015   Formatting of this note might be different from the original. Dr. Nila Nephew managing.  Twice a year.  . Mixed hyperlipidemia 09/28/2019  . Onychomycosis 07/24/2015  . Osteoarthritis 07/24/2015  . Sleep apnea 07/24/2015   Formatting of this note might be different from the original. Infrequent CPAP  . Ventral hernia without obstruction or gangrene 08/21/2016    Formatting of this note might be different from the original. Diastasis recti.    Past Surgical History:  Procedure Laterality Date  . RADIOACTIVE SEED IMPLANT  2010  . REPLACEMENT TOTAL KNEE BILATERAL    . TONSILLECTOMY      Current Medications: Current Meds  Medication Sig  . ascorbic acid (VITAMIN C) 500 MG tablet Take 1 tablet by mouth daily.  Marland Kitchen b complex vitamins capsule Take 1 capsule by mouth daily.  . calcium-vitamin D (OSCAL WITH D) 500-200 MG-UNIT TABS tablet Take 1 tablet by mouth 2 (two) times daily.  . Cholecalciferol 25 MCG (1000 UT) tablet Take 1 tablet by mouth daily.  . Cinnamon 500 MG capsule Take 1 capsule by mouth at bedtime.  . Copper Gluconate (COPPER CAPS) 2 MG CAPS Take 1 capsule by mouth at bedtime.  . Garlic 638 MG CAPS Take 1 capsule by mouth daily.  . Ginkgo Biloba 40 MG TABS Take 1 tablet by mouth daily.  . Lecithin 1200 MG CAPS Take 1 capsule by mouth daily.  . Lutein 20 MG CAPS Take 2 capsules by mouth daily.  . Milk Thistle 500 MG CAPS Take 1 capsule by mouth daily.  . Multiple Vitamin (MULTIVITAMIN) capsule Take 1 capsule by mouth daily.  . Multiple Vitamins-Minerals (PRESERVISION AREDS 2+MULTI VIT PO) Take 1 tablet by mouth at bedtime.  . Omega-3 Fatty Acids (OMEGA-3 FISH OIL) 500 MG CAPS Take 2 capsules by mouth daily.  . Potassium Gluconate 2.5 MEQ TABS Take 1 tablet by mouth daily as needed.  . vitamin E 180 MG (400 UNITS) capsule Take 1 capsule by mouth daily.     Allergies:   Doxycycline hyclate   Social History   Socioeconomic History  . Marital status: Married    Spouse name: Not on file  . Number of children: Not on file  . Years of education: Not on file  . Highest education level: Not on file  Occupational History  . Not on file  Tobacco Use  . Smoking status: Never Smoker  . Smokeless tobacco: Never Used  Vaping Use  . Vaping Use: Never used  Substance and Sexual Activity  . Alcohol use: Never  . Drug use: Never  . Sexual  activity: Not on file  Other Topics Concern  . Not on file  Social History Narrative  . Not on file   Social Determinants of Health   Financial Resource Strain:   . Difficulty of Paying Living Expenses: Not on file  Food Insecurity:   . Worried About Charity fundraiser in the Last Year: Not on file  . Ran Out of Food in the Last Year: Not on file  Transportation Needs:   . Lack of Transportation (Medical): Not on file  . Lack of Transportation (Non-Medical): Not on file  Physical Activity:   . Days of Exercise per Week: Not on file  . Minutes of Exercise  per Session: Not on file  Stress:   . Feeling of Stress : Not on file  Social Connections:   . Frequency of Communication with Friends and Family: Not on file  . Frequency of Social Gatherings with Friends and Family: Not on file  . Attends Religious Services: Not on file  . Active Member of Clubs or Organizations: Not on file  . Attends Archivist Meetings: Not on file  . Marital Status: Not on file     Family History: The patient's family history includes Alzheimer's disease in his brother; Heart disease in his brother and brother; Heart failure in his mother; Hypertension in his mother; Melanoma in his brother and brother.  ROS:   ROS Please see the history of present illness.     All other systems reviewed and are negative.  EKGs/Labs/Other Studies Reviewed:    The following studies were reviewed today:   EKG:  EKG is  ordered today.  The ekg ordered today is personally reviewed and demonstrates sinus rhythm left ventricular hypertrophy with repolarization.  He handcarried an EKG I interpreted 2013 with similar but less pronounced pattern.    Physical Exam:    VS:  BP (!) 154/68   Pulse 63   Ht 6' (1.829 m)   Wt 223 lb 9.6 oz (101.4 kg)   SpO2 94%   BMI 30.33 kg/m     Wt Readings from Last 3 Encounters:  03/11/20 223 lb 9.6 oz (101.4 kg)     GEN:  Well nourished, well developed in no acute  distress HEENT: Normal NECK: No JVD; No carotid bruits LYMPHATICS: No lymphadenopathy CARDIAC: RRR, no murmurs, rubs, gallops RESPIRATORY:  Clear to auscultation without rales, wheezing or rhonchi  ABDOMEN: Soft, non-tender, non-distended MUSCULOSKELETAL:  No edema; No deformity  SKIN: Warm and dry NEUROLOGIC:  Alert and oriented x 3 PSYCHIATRIC:  Normal affect     Signed, Shirlee More, MD  03/11/2020 2:54 PM     [facility]

## 2020-03-11 ENCOUNTER — Other Ambulatory Visit: Payer: Self-pay

## 2020-03-11 ENCOUNTER — Ambulatory Visit: Payer: PPO | Admitting: Cardiology

## 2020-03-11 ENCOUNTER — Encounter: Payer: Self-pay | Admitting: Cardiology

## 2020-03-11 VITALS — BP 154/68 | HR 63 | Ht 72.0 in | Wt 223.6 lb

## 2020-03-11 DIAGNOSIS — R9431 Abnormal electrocardiogram [ECG] [EKG]: Secondary | ICD-10-CM | POA: Diagnosis not present

## 2020-03-11 DIAGNOSIS — Z01810 Encounter for preprocedural cardiovascular examination: Secondary | ICD-10-CM | POA: Diagnosis not present

## 2020-03-11 DIAGNOSIS — R030 Elevated blood-pressure reading, without diagnosis of hypertension: Secondary | ICD-10-CM | POA: Diagnosis not present

## 2020-03-11 MED ORDER — LOSARTAN POTASSIUM 50 MG PO TABS: 50.0000 mg | ORAL_TABLET | Freq: Every day | ORAL | 3 refills | Status: DC

## 2020-03-11 NOTE — Patient Instructions (Signed)
Medication Instructions:  Your physician has recommended you make the following change in your medication:  START: Cozaar 50 mg take one tablet by mouth daily.  *If you need a refill on your cardiac medications before your next appointment, please call your pharmacy*   Lab Work: None If you have labs (blood work) drawn today and your tests are completely normal, you will receive your results only by: Marland Kitchen MyChart Message (if you have MyChart) OR . A paper copy in the mail If you have any lab test that is abnormal or we need to change your treatment, we will call you to review the results.   Testing/Procedures: None   Follow-Up: At [facility], you and your health needs are our priority.  As part of our continuing mission to provide you with exceptional heart care, we have created designated Provider Care Teams.  These Care Teams include your primary Cardiologist (physician) and Advanced Practice Providers (APPs -  Physician Assistants and Nurse Practitioners) who all work together to provide you with the care you need, when you need it.  We recommend signing up for the patient portal called "MyChart".  Sign up information is provided on this After Visit Summary.  MyChart is used to connect with patients for Virtual Visits (Telemedicine).  Patients are able to view lab/test results, encounter notes, upcoming appointments, etc.  Non-urgent messages can be sent to your provider as well.   To learn more about what you can do with MyChart, go to [email or web address].    Your next appointment:  As needed  The format for your next appointment:   In Person  Provider:   Shirlee More, MD   Other Instructions

## 2020-03-27 DIAGNOSIS — H353131 Nonexudative age-related macular degeneration, bilateral, early dry stage: Secondary | ICD-10-CM | POA: Diagnosis not present

## 2020-04-08 ENCOUNTER — Other Ambulatory Visit: Payer: Self-pay | Admitting: Orthopedic Surgery

## 2020-04-10 DIAGNOSIS — C61 Malignant neoplasm of prostate: Secondary | ICD-10-CM | POA: Diagnosis not present

## 2020-04-10 DIAGNOSIS — R351 Nocturia: Secondary | ICD-10-CM | POA: Diagnosis not present

## 2020-05-06 NOTE — Patient Instructions (Addendum)
DUE TO COVID-19 ONLY ONE VISITOR IS ALLOWED TO COME WITH YOU AND STAY IN THE WAITING ROOM ONLY DURING PRE OP AND PROCEDURE DAY OF SURGERY. THE 1 VISITOR  MAY VISIT WITH YOU AFTER SURGERY IN YOUR PRIVATE ROOM DURING VISITING HOURS ONLY!  YOU NEED TO HAVE A COVID 19 TEST ON: 06/09/20 @ 9:00 AM , THIS TEST MUST BE DONE BEFORE SURGERY,  COVID TESTING SITE [place] [id number], IT IS ON THE RIGHT GOING OUT [address] APPROXIMATELY  2 MINUTES PAST [employer] ON THE RIGHT. ONCE YOUR COVID TEST IS COMPLETED,  PLEASE BEGIN THE QUARANTINE INSTRUCTIONS AS OUTLINED IN YOUR HANDOUT.                Austin Ayala    Your procedure is scheduled on: 05/13/20   Report to [facility] Main  Entrance   Report to admitting at: 8:45 AM     Call this number if you have problems the morning of surgery [phone]    Remember:   NO SOLID FOOD AFTER MIDNIGHT THE NIGHT PRIOR TO SURGERY. NOTHING BY MOUTH EXCEPT CLEAR LIQUIDS UNTIL: 8:15 AM . PLEASE FINISH ENSURE DRINK PER SURGEON ORDER  WHICH NEEDS TO BE COMPLETED AT: 8:15 AM .  CLEAR LIQUID DIET   Foods Allowed                                                                     Foods Excluded  Coffee and tea, regular and decaf                             liquids that you cannot  Plain Jell-O any favor except red or purple                                           see through such as: Fruit ices (not with fruit pulp)                                     milk, soups, orange juice  Iced Popsicles                                    All solid food Carbonated beverages, regular and diet                                    Cranberry, grape and apple juices Sports drinks like Gatorade Lightly seasoned clear broth or consume(fat free) Sugar, honey syrup  Sample Menu Breakfast                                Lunch  Supper Cranberry juice                    Beef broth                             Chicken broth Jell-O                                     Grape juice                           Apple juice Coffee or tea                        Jell-O                                      Popsicle                                                Coffee or tea                        Coffee or tea  _____________________________________________________________________  BRUSH YOUR TEETH MORNING OF SURGERY AND RINSE YOUR MOUTH OUT, NO CHEWING GUM CANDY OR MINTS.                      You may not have any metal on your body including hair pins and              piercings  Do not wear jewelry, lotions, powders or perfumes, deodorant             Men may shave face and neck.   Do not bring valuables to the hospital. Austin Ayala.  Contacts, dentures or bridgework may not be worn into surgery.  Leave suitcase in the car. After surgery it may be brought to your room.     Patients discharged the day of surgery will not be allowed to drive home. IF YOU ARE HAVING SURGERY AND GOING HOME THE SAME DAY, YOU MUST HAVE AN ADULT TO DRIVE YOU HOME AND BE WITH YOU FOR 24 HOURS. YOU MAY GO HOME BY TAXI OR UBER OR ORTHERWISE, BUT AN ADULT MUST ACCOMPANY YOU HOME AND STAY WITH YOU FOR 24 HOURS.  Name and phone number of your driver:  Special Instructions: N/A              Please read over the following fact sheets you were given: ____________________________________________________________________          [facility] - Preparing for Surgery Before surgery, you can play an important role.  Because skin is not sterile, your skin needs to be as free of germs as possible.  You can reduce the number of germs on your skin by washing with CHG (chlorahexidine gluconate) soap before surgery.  CHG is an antiseptic cleaner which kills germs and bonds with the skin to continue killing germs even after washing. Please  DO NOT use if you have an allergy to CHG or antibacterial  soaps.  If your skin becomes reddened/irritated stop using the CHG and inform your nurse when you arrive at Short Stay. Do not shave (including legs and underarms) for at least 48 hours prior to the first CHG shower.  You may shave your face/neck. Please follow these instructions carefully:  1.  Shower with CHG Soap the night before surgery and the  morning of Surgery.  2.  If you choose to wash your hair, wash your hair first as usual with your  normal  shampoo.  3.  After you shampoo, rinse your hair and body thoroughly to remove the  shampoo.                           4.  Use CHG as you would any other liquid soap.  You can apply chg directly  to the skin and wash                       Gently with a scrungie or clean washcloth.  5.  Apply the CHG Soap to your body ONLY FROM THE NECK DOWN.   Do not use on face/ open                           Wound or open sores. Avoid contact with eyes, ears mouth and genitals (private parts).                       Wash face,  Genitals (private parts) with your normal soap.             6.  Wash thoroughly, paying special attention to the area where your surgery  will be performed.  7.  Thoroughly rinse your body with warm water from the neck down.  8.  DO NOT shower/wash with your normal soap after using and rinsing off  the CHG Soap.                9.  Pat yourself dry with a clean towel.            10.  Wear clean pajamas.            11.  Place clean sheets on your bed the night of your first shower and do not  sleep with pets. Day of Surgery : Do not apply any lotions/deodorants the morning of surgery.  Please wear clean clothes to the hospital/surgery center.  FAILURE TO FOLLOW THESE INSTRUCTIONS MAY RESULT IN THE CANCELLATION OF YOUR SURGERY PATIENT SIGNATURE_________________________________  NURSE SIGNATURE__________________________________  ________________________________________________________________________   Austin Ayala  An  incentive spirometer is a tool that can help keep your lungs clear and active. This tool measures how well you are filling your lungs with each breath. Taking long deep breaths may help reverse or decrease the chance of developing breathing (pulmonary) problems (especially infection) following:  A long period of time when you are unable to move or be active. BEFORE THE PROCEDURE   If the spirometer includes an indicator to show your best effort, your nurse or respiratory therapist will set it to a desired goal.  If possible, sit up straight or lean slightly forward. Try not to slouch.  Hold the incentive spirometer in an upright position. INSTRUCTIONS FOR USE  1. Sit on the edge of your bed  if possible, or sit up as far as you can in bed or on a chair. 2. Hold the incentive spirometer in an upright position. 3. Breathe out normally. 4. Place the mouthpiece in your mouth and seal your lips tightly around it. 5. Breathe in slowly and as deeply as possible, raising the piston or the ball toward the top of the column. 6. Hold your breath for 3-5 seconds or for as long as possible. Allow the piston or ball to fall to the bottom of the column. 7. Remove the mouthpiece from your mouth and breathe out normally. 8. Rest for a few seconds and repeat Steps 1 through 7 at least 10 times every 1-2 hours when you are awake. Take your time and take a few normal breaths between deep breaths. 9. The spirometer may include an indicator to show your best effort. Use the indicator as a goal to work toward during each repetition. 10. After each set of 10 deep breaths, practice coughing to be sure your lungs are clear. If you have an incision (the cut made at the time of surgery), support your incision when coughing by placing a pillow or rolled up towels firmly against it. Once you are able to get out of bed, walk around indoors and cough well. You may stop using the incentive spirometer when instructed by your  caregiver.  RISKS AND COMPLICATIONS  Take your time so you do not get dizzy or light-headed.  If you are in pain, you may need to take or ask for pain medication before doing incentive spirometry. It is harder to take a deep breath if you are having pain. AFTER USE  Rest and breathe slowly and easily.  It can be helpful to keep track of a log of your progress. Your caregiver can provide you with a simple table to help with this. If you are using the spirometer at home, follow these instructions: [place] IF:   You are having difficultly using the spirometer.  You have trouble using the spirometer as often as instructed.  Your pain medication is not giving enough relief while using the spirometer.  You develop fever of 100.5 F (38.1 C) or higher. SEEK IMMEDIATE MEDICAL CARE IF:   You cough up bloody sputum that had not been present before.  You develop fever of 102 F (38.9 C) or greater.  You develop worsening pain at or near the incision site. MAKE SURE YOU:   Understand these instructions.  Will watch your condition.  Will get help right away if you are not doing well or get worse. Document Released: 11/09/2006 Document Revised: 09/21/2011 Document Reviewed: 01/10/2007 [facility] Patient Information 2014 [place], [place].   ________________________________________________________________________

## 2020-05-07 ENCOUNTER — Other Ambulatory Visit: Payer: Self-pay

## 2020-05-07 ENCOUNTER — Encounter (HOSPITAL_COMMUNITY)
Admission: RE | Admit: 2020-05-07 | Discharge: 2020-05-07 | Disposition: A | Discharge status: Home / Self Care | Payer: PPO | Source: Ambulatory Visit | Attending: Orthopedic Surgery | Admitting: Orthopedic Surgery

## 2020-05-07 ENCOUNTER — Encounter (HOSPITAL_COMMUNITY): Payer: Self-pay

## 2020-05-07 DIAGNOSIS — Z01812 Encounter for preprocedural laboratory examination: Secondary | ICD-10-CM | POA: Insufficient documentation

## 2020-05-07 HISTORY — DX: Cardiac murmur, unspecified: R01.1

## 2020-05-07 HISTORY — DX: Essential (primary) hypertension: I10

## 2020-05-07 LAB — CBC WITH DIFFERENTIAL/PLATELET
Abs Immature Granulocytes: 0.03 10*3/uL (ref 0.00–0.07)
Basophils Absolute: 0.1 10*3/uL (ref 0.0–0.1)
Basophils Relative: 1 %
Eosinophils Absolute: 0.1 10*3/uL (ref 0.0–0.5)
Eosinophils Relative: 1 %
HCT: 43.9 % (ref 39.0–52.0)
Hemoglobin: 14.3 g/dL (ref 13.0–17.0)
Immature Granulocytes: 1 %
Lymphocytes Relative: 27 %
Lymphs Abs: 1.6 10*3/uL (ref 0.7–4.0)
MCH: 31.2 pg (ref 26.0–34.0)
MCHC: 32.6 g/dL (ref 30.0–36.0)
MCV: 95.9 fL (ref 80.0–100.0)
Monocytes Absolute: 0.8 10*3/uL (ref 0.1–1.0)
Monocytes Relative: 13 %
Neutro Abs: 3.4 10*3/uL (ref 1.7–7.7)
Neutrophils Relative %: 57 %
Platelets: 327 10*3/uL (ref 150–400)
RBC: 4.58 MIL/uL (ref 4.22–5.81)
RDW: 12.9 % (ref 11.5–15.5)
WBC: 6 10*3/uL (ref 4.0–10.5)
nRBC: 0 % (ref 0.0–0.2)

## 2020-05-07 LAB — COMPREHENSIVE METABOLIC PANEL
ALT: 30 U/L (ref 0–44)
AST: 31 U/L (ref 15–41)
Albumin: 4.5 g/dL (ref 3.5–5.0)
Alkaline Phosphatase: 69 U/L (ref 38–126)
Anion gap: 8 (ref 5–15)
BUN: 14 mg/dL (ref 8–23)
CO2: 27 mmol/L (ref 22–32)
Calcium: 9.8 mg/dL (ref 8.9–10.3)
Chloride: 102 mmol/L (ref 98–111)
Creatinine, Ser: 0.71 mg/dL (ref 0.61–1.24)
GFR, Estimated: >60 mL/min (ref >60)
Glucose, Bld: 116 mg/dL — ABNORMAL HIGH (ref 70–99)
Potassium: 5 mmol/L (ref 3.5–5.1)
Sodium: 137 mmol/L (ref 135–145)
Total Bilirubin: 1 mg/dL (ref 0.3–1.2)
Total Protein: 7.4 g/dL (ref 6.5–8.1)

## 2020-05-07 LAB — SURGICAL PCR SCREEN
MRSA, PCR: NEGATIVE
Staphylococcus aureus: NEGATIVE

## 2020-05-07 NOTE — Progress Notes (Addendum)
COVID Vaccine Completed: yes Date COVID Vaccine completed: 03/25/20. [place] COVID vaccine manufacturer: Pfizer     PCP - Dr. Raina Mina Cardiologist - Dr. Shirlee More.: Clearance: 03/11/20: EPIC  Chest x-ray -  EKG - 03/11/20. EPIC Stress Test -  ECHO -  Cardiac Cath -  Pacemaker/ICD device last checked:  Sleep Study - Yes CPAP - NO  Fasting Blood Sugar -  Checks Blood Sugar _____ times a day  Blood Thinner Instructions: Aspirin Instructions: Last Dose:  Anesthesia review:   Patient denies shortness of breath, fever, cough and chest pain at PAT appointment   Patient verbalized understanding of instructions that were given to them at the PAT appointment. Patient was also instructed that they will need to review over the PAT instructions again at home before surgery.

## 2020-05-09 ENCOUNTER — Other Ambulatory Visit (HOSPITAL_COMMUNITY)
Admission: RE | Admit: 2020-05-09 | Discharge: 2020-05-09 | Disposition: A | Discharge status: Home / Self Care | Payer: PPO | Source: Ambulatory Visit | Attending: Orthopedic Surgery | Admitting: Orthopedic Surgery

## 2020-05-09 DIAGNOSIS — Z20822 Contact with and (suspected) exposure to covid-19: Secondary | ICD-10-CM | POA: Diagnosis not present

## 2020-05-09 DIAGNOSIS — Z01812 Encounter for preprocedural laboratory examination: Secondary | ICD-10-CM | POA: Diagnosis not present

## 2020-05-09 LAB — SARS CORONAVIRUS 2 (TAT 6-24 HRS): SARS Coronavirus 2: NEGATIVE

## 2020-05-10 ENCOUNTER — Other Ambulatory Visit: Payer: Self-pay | Admitting: Orthopedic Surgery

## 2020-05-12 MED ORDER — BUPIVACAINE LIPOSOME 1.3 % IJ SUSP
20.0000 mL | INTRAMUSCULAR | Status: DC
  Filled 2020-05-12: qty 20

## 2020-05-13 ENCOUNTER — Other Ambulatory Visit: Payer: Self-pay

## 2020-05-13 ENCOUNTER — Encounter (HOSPITAL_COMMUNITY): Payer: Self-pay | Admitting: Orthopedic Surgery

## 2020-05-13 ENCOUNTER — Ambulatory Visit (HOSPITAL_COMMUNITY): Payer: PPO | Admitting: Certified Registered"

## 2020-05-13 ENCOUNTER — Encounter (HOSPITAL_COMMUNITY)
Admission: RE | Disposition: A | Discharge status: Home / Self Care | Payer: Self-pay | Source: Home / Self Care | Attending: Orthopedic Surgery

## 2020-05-13 ENCOUNTER — Ambulatory Visit (HOSPITAL_COMMUNITY)
Admission: RE | Admit: 2020-05-13 | Discharge: 2020-05-13 | Disposition: A | Discharge status: Home / Self Care | Payer: PPO | Attending: Orthopedic Surgery | Admitting: Orthopedic Surgery

## 2020-05-13 DIAGNOSIS — E782 Mixed hyperlipidemia: Secondary | ICD-10-CM | POA: Diagnosis not present

## 2020-05-13 DIAGNOSIS — Z96653 Presence of artificial knee joint, bilateral: Secondary | ICD-10-CM | POA: Insufficient documentation

## 2020-05-13 DIAGNOSIS — T84033A Mechanical loosening of internal left knee prosthetic joint, initial encounter: Secondary | ICD-10-CM | POA: Insufficient documentation

## 2020-05-13 DIAGNOSIS — I10 Essential (primary) hypertension: Secondary | ICD-10-CM | POA: Diagnosis not present

## 2020-05-13 DIAGNOSIS — Y831 Surgical operation with implant of artificial internal device as the cause of abnormal reaction of the patient, or of later complication, without mention of misadventure at the time of the procedure: Secondary | ICD-10-CM | POA: Diagnosis not present

## 2020-05-13 DIAGNOSIS — Z96652 Presence of left artificial knee joint: Secondary | ICD-10-CM | POA: Diagnosis not present

## 2020-05-13 DIAGNOSIS — M1712 Unilateral primary osteoarthritis, left knee: Secondary | ICD-10-CM | POA: Diagnosis not present

## 2020-05-13 DIAGNOSIS — G8918 Other acute postprocedural pain: Secondary | ICD-10-CM | POA: Diagnosis not present

## 2020-05-13 HISTORY — PX: TOTAL KNEE REVISION: SHX996

## 2020-05-13 SURGERY — TOTAL KNEE REVISION
Anesthesia: Spinal | Site: Knee | Laterality: Left

## 2020-05-13 MED ORDER — LACTATED RINGERS IV BOLUS
250.0000 mL | Freq: Once | INTRAVENOUS | Status: AC
  Administered 2020-05-13: 250 mL via INTRAVENOUS

## 2020-05-13 MED ORDER — 0.9 % SODIUM CHLORIDE (POUR BTL) OPTIME
TOPICAL | Status: DC | PRN
  Administered 2020-05-13: 1000 mL

## 2020-05-13 MED ORDER — PROPOFOL 10 MG/ML IV BOLUS
INTRAVENOUS | Status: AC
  Filled 2020-05-13: qty 20

## 2020-05-13 MED ORDER — CHLORHEXIDINE GLUCONATE 0.12 % MT SOLN
15.0000 mL | Freq: Once | OROMUCOSAL | Status: AC
  Administered 2020-05-13: 15 mL via OROMUCOSAL

## 2020-05-13 MED ORDER — SODIUM CHLORIDE 0.9% FLUSH
INTRAVENOUS | Status: DC | PRN
  Administered 2020-05-13: 20 mL

## 2020-05-13 MED ORDER — MEPIVACAINE HCL (PF) 2 % IJ SOLN
INTRAMUSCULAR | Status: DC | PRN
  Administered 2020-05-13: 3 mL via EPIDURAL

## 2020-05-13 MED ORDER — TRANEXAMIC ACID-NACL 1000-0.7 MG/100ML-% IV SOLN
1000.0000 mg | INTRAVENOUS | Status: AC
  Administered 2020-05-13: 1000 mg via INTRAVENOUS
  Filled 2020-05-13: qty 100

## 2020-05-13 MED ORDER — DEXAMETHASONE SODIUM PHOSPHATE 10 MG/ML IJ SOLN: 8.0000 mg | Freq: Once | INTRAMUSCULAR | Status: DC

## 2020-05-13 MED ORDER — OXYCODONE HCL 5 MG PO TABS: 5.0000 mg | ORAL_TABLET | ORAL | 0 refills | Status: DC | PRN

## 2020-05-13 MED ORDER — ASPIRIN EC 325 MG PO TBEC: 325.0000 mg | DELAYED_RELEASE_TABLET | Freq: Two times a day (BID) | ORAL | 0 refills | Status: AC

## 2020-05-13 MED ORDER — DEXAMETHASONE SODIUM PHOSPHATE 10 MG/ML IJ SOLN
INTRAMUSCULAR | Status: DC | PRN
  Administered 2020-05-13: 8 mg via INTRAVENOUS

## 2020-05-13 MED ORDER — OXYCODONE HCL 5 MG/5ML PO SOLN: 5.0000 mg | Freq: Once | ORAL | Status: AC | PRN

## 2020-05-13 MED ORDER — METHOCARBAMOL 500 MG PO TABS: 500.0000 mg | ORAL_TABLET | Freq: Four times a day (QID) | ORAL | Status: DC | PRN

## 2020-05-13 MED ORDER — CEFAZOLIN SODIUM-DEXTROSE 2-4 GM/100ML-% IV SOLN: 2.0000 g | Freq: Four times a day (QID) | INTRAVENOUS | Status: DC

## 2020-05-13 MED ORDER — CELECOXIB 200 MG PO CAPS
400.0000 mg | ORAL_CAPSULE | Freq: Once | ORAL | Status: AC
  Administered 2020-05-13: 400 mg via ORAL
  Filled 2020-05-13: qty 2

## 2020-05-13 MED ORDER — LACTATED RINGERS IV BOLUS
500.0000 mL | Freq: Once | INTRAVENOUS | Status: AC
  Administered 2020-05-13: 500 mL via INTRAVENOUS

## 2020-05-13 MED ORDER — OXYCODONE HCL 5 MG PO TABS
5.0000 mg | ORAL_TABLET | Freq: Once | ORAL | Status: AC | PRN
  Administered 2020-05-13: 5 mg via ORAL

## 2020-05-13 MED ORDER — LIDOCAINE 2% (20 MG/ML) 5 ML SYRINGE
INTRAMUSCULAR | Status: DC | PRN
  Administered 2020-05-13: 20 mg via INTRAVENOUS

## 2020-05-13 MED ORDER — METHOCARBAMOL 500 MG IVPB - SIMPLE MED: 500.0000 mg | Freq: Four times a day (QID) | INTRAVENOUS | Status: DC | PRN

## 2020-05-13 MED ORDER — FENTANYL CITRATE (PF) 100 MCG/2ML IJ SOLN
INTRAMUSCULAR | Status: AC
  Filled 2020-05-13: qty 2

## 2020-05-13 MED ORDER — LACTATED RINGERS IV SOLN: INTRAVENOUS | Status: DC

## 2020-05-13 MED ORDER — POVIDONE-IODINE 10 % EX SWAB
2.0000 "application " | Freq: Once | CUTANEOUS | Status: AC
  Administered 2020-05-13: 2 via TOPICAL

## 2020-05-13 MED ORDER — HYDROMORPHONE HCL 1 MG/ML IJ SOLN: 0.2500 mg | INTRAMUSCULAR | Status: DC | PRN

## 2020-05-13 MED ORDER — PROPOFOL 10 MG/ML IV BOLUS
INTRAVENOUS | Status: DC | PRN
  Administered 2020-05-13 (×4): 20 mg via INTRAVENOUS

## 2020-05-13 MED ORDER — SODIUM CHLORIDE (PF) 0.9 % IJ SOLN
INTRAMUSCULAR | Status: AC
  Filled 2020-05-13: qty 50

## 2020-05-13 MED ORDER — METHOCARBAMOL 500 MG PO TABS
500.0000 mg | ORAL_TABLET | Freq: Four times a day (QID) | ORAL | 0 refills | Status: DC
Start: 2020-05-13 — End: 2023-03-24

## 2020-05-13 MED ORDER — PROMETHAZINE HCL 25 MG/ML IJ SOLN: 6.2500 mg | INTRAMUSCULAR | Status: DC | PRN

## 2020-05-13 MED ORDER — TRANEXAMIC ACID 1000 MG/10ML IV SOLN
2000.0000 mg | Freq: Once | INTRAVENOUS | Status: AC
  Administered 2020-05-13: 2000 mg via TOPICAL
  Filled 2020-05-13: qty 20

## 2020-05-13 MED ORDER — PROPOFOL 500 MG/50ML IV EMUL
INTRAVENOUS | Status: DC | PRN
  Administered 2020-05-13: 75 ug/kg/min via INTRAVENOUS

## 2020-05-13 MED ORDER — GABAPENTIN 300 MG PO CAPS
300.0000 mg | ORAL_CAPSULE | Freq: Once | ORAL | Status: AC
  Administered 2020-05-13: 300 mg via ORAL
  Filled 2020-05-13: qty 1

## 2020-05-13 MED ORDER — CEFAZOLIN SODIUM-DEXTROSE 2-4 GM/100ML-% IV SOLN
2.0000 g | INTRAVENOUS | Status: AC
  Administered 2020-05-13: 2 g via INTRAVENOUS
  Filled 2020-05-13: qty 100

## 2020-05-13 MED ORDER — ACETAMINOPHEN 500 MG PO TABS: 1000.0000 mg | ORAL_TABLET | Freq: Once | ORAL | Status: DC

## 2020-05-13 MED ORDER — FENTANYL CITRATE (PF) 100 MCG/2ML IJ SOLN
50.0000 ug | INTRAMUSCULAR | Status: DC
  Administered 2020-05-13: 50 ug via INTRAVENOUS
  Filled 2020-05-13: qty 2

## 2020-05-13 MED ORDER — SODIUM CHLORIDE 0.9 % IR SOLN
Status: DC | PRN
  Administered 2020-05-13: 1000 mL

## 2020-05-13 MED ORDER — ROPIVACAINE HCL 7.5 MG/ML IJ SOLN
INTRAMUSCULAR | Status: DC | PRN
  Administered 2020-05-13: 20 mL via PERINEURAL

## 2020-05-13 MED ORDER — BUPIVACAINE-EPINEPHRINE (PF) 0.25% -1:200000 IJ SOLN
INTRAMUSCULAR | Status: AC
  Filled 2020-05-13: qty 30

## 2020-05-13 MED ORDER — OXYCODONE HCL 5 MG PO TABS
ORAL_TABLET | ORAL | Status: AC
  Filled 2020-05-13: qty 1

## 2020-05-13 MED ORDER — MEPERIDINE HCL 50 MG/ML IJ SOLN: 6.2500 mg | INTRAMUSCULAR | Status: DC | PRN

## 2020-05-13 MED ORDER — BUPIVACAINE LIPOSOME 1.3 % IJ SUSP
INTRAMUSCULAR | Status: DC | PRN
  Administered 2020-05-13: 20 mL

## 2020-05-13 MED ORDER — EPHEDRINE SULFATE-NACL 50-0.9 MG/10ML-% IV SOSY
PREFILLED_SYRINGE | INTRAVENOUS | Status: DC | PRN
  Administered 2020-05-13: 10 mg via INTRAVENOUS
  Administered 2020-05-13: 5 mg via INTRAVENOUS
  Administered 2020-05-13: 10 mg via INTRAVENOUS
  Administered 2020-05-13: 5 mg via INTRAVENOUS
  Administered 2020-05-13: 10 mg via INTRAVENOUS

## 2020-05-13 MED ORDER — STERILE WATER FOR IRRIGATION IR SOLN
Status: DC | PRN
  Administered 2020-05-13: 2000 mL

## 2020-05-13 MED ORDER — ONDANSETRON HCL 4 MG/2ML IJ SOLN
INTRAMUSCULAR | Status: DC | PRN
  Administered 2020-05-13: 4 mg via INTRAVENOUS

## 2020-05-13 MED ORDER — FENTANYL CITRATE (PF) 100 MCG/2ML IJ SOLN
INTRAMUSCULAR | Status: DC | PRN
  Administered 2020-05-13 (×2): 50 ug via INTRAVENOUS

## 2020-05-13 MED ORDER — ORAL CARE MOUTH RINSE: 15.0000 mL | Freq: Once | OROMUCOSAL | Status: AC

## 2020-05-13 MED ORDER — BUPIVACAINE-EPINEPHRINE 0.5% -1:200000 IJ SOLN
INTRAMUSCULAR | Status: DC | PRN
  Administered 2020-05-13: 30 mL

## 2020-05-13 MED ORDER — METHOCARBAMOL 500 MG IVPB - SIMPLE MED
INTRAVENOUS | Status: AC
  Administered 2020-05-13: 500 mg via INTRAVENOUS
  Filled 2020-05-13: qty 50

## 2020-05-13 MED ORDER — MIDAZOLAM HCL 2 MG/2ML IJ SOLN: 0.5000 mg | Freq: Once | INTRAMUSCULAR | Status: DC | PRN

## 2020-05-13 SURGICAL SUPPLY — 64 items
BAG ZIPLOCK 12X15 (MISCELLANEOUS) ×3 IMPLANT
BLADE FLEX CHISEL 8 2.6 (MISCELLANEOUS) ×3 IMPLANT
BLADE SAGITTAL 13X1.27X60 (BLADE) ×2 IMPLANT
BLADE SAGITTAL 13X1.27X60MM (BLADE) ×1
BLADE SAW SGTL 81X20 HD (BLADE) ×3 IMPLANT
BLADE SAW SGTL 83.5X18.5 (BLADE) ×3 IMPLANT
BLADE SURG 15 STRL LF DISP TIS (BLADE) ×1 IMPLANT
BLADE SURG 15 STRL SS (BLADE) ×2
BLADE SURG SZ10 CARB STEEL (BLADE) ×12 IMPLANT
BNDG ELASTIC 6X15 VLCR STRL LF (GAUZE/BANDAGES/DRESSINGS) ×3 IMPLANT
BONE CEMENT SIMPLEX TOBRAMYCIN (Cement) ×6 IMPLANT
BOWL SMART MIX CTS (DISPOSABLE) ×3 IMPLANT
CEMENT BONE SIMPLEX TOBRAMYCIN (Cement) ×3 IMPLANT
CLOSURE WOUND 1/2 X4 (GAUZE/BANDAGES/DRESSINGS) ×1
COMP TIBIA FIXED H LT (Joint) ×3 IMPLANT
COMPONET TIBIA FIXED H LT (Joint) ×1 IMPLANT
COVER SURGICAL LIGHT HANDLE (MISCELLANEOUS) ×3 IMPLANT
COVER WAND RF STERILE (DRAPES) IMPLANT
CUFF TOURN SGL QUICK 34 (TOURNIQUET CUFF)
CUFF TRNQT CYL 34X4.125X (TOURNIQUET CUFF) IMPLANT
DECANTER SPIKE VIAL GLASS SM (MISCELLANEOUS) IMPLANT
DRAPE INCISE IOBAN 66X45 STRL (DRAPES) ×6 IMPLANT
DRAPE U-SHAPE 47X51 STRL (DRAPES) ×3 IMPLANT
DRSG AQUACEL AG ADV 3.5X10 (GAUZE/BANDAGES/DRESSINGS) ×3 IMPLANT
DRSG AQUACEL AG ADV 3.5X14 (GAUZE/BANDAGES/DRESSINGS) ×3 IMPLANT
DURAPREP 26ML APPLICATOR (WOUND CARE) ×3 IMPLANT
ELECT REM PT RETURN 15FT ADLT (MISCELLANEOUS) ×3 IMPLANT
GLOVE BIOGEL M STRL SZ7.5 (GLOVE) IMPLANT
GLOVE BIOGEL PI IND STRL 7.5 (GLOVE) IMPLANT
GLOVE BIOGEL PI IND STRL 8.5 (GLOVE) ×2 IMPLANT
GLOVE BIOGEL PI INDICATOR 7.5 (GLOVE)
GLOVE BIOGEL PI INDICATOR 8.5 (GLOVE) ×4
GLOVE SURG ORTHO 8.0 STRL STRW (GLOVE) ×9 IMPLANT
GOWN STRL REUS W/ TWL XL LVL3 (GOWN DISPOSABLE) ×2 IMPLANT
GOWN STRL REUS W/TWL XL LVL3 (GOWN DISPOSABLE) ×4
HANDPIECE INTERPULSE COAX TIP (DISPOSABLE) ×2
HOLDER FOLEY CATH W/STRAP (MISCELLANEOUS) IMPLANT
HOOD PEEL AWAY FLYTE STAYCOOL (MISCELLANEOUS) ×9 IMPLANT
INSERT ART REV PERS LT 11-13 (Insert) ×3 IMPLANT
KIT TURNOVER KIT A (KITS) IMPLANT
MANIFOLD NEPTUNE II (INSTRUMENTS) ×3 IMPLANT
NEEDLE HYPO 22GX1.5 SAFETY (NEEDLE) ×3 IMPLANT
NS IRRIG 1000ML POUR BTL (IV SOLUTION) ×3 IMPLANT
PACK TOTAL KNEE CUSTOM (KITS) ×3 IMPLANT
PENCIL SMOKE EVACUATOR (MISCELLANEOUS) IMPLANT
PROSTHESIS FEM KNEE 11 LT (Knees) ×3 IMPLANT
PROTECTOR NERVE ULNAR (MISCELLANEOUS) ×3 IMPLANT
SET HNDPC FAN SPRY TIP SCT (DISPOSABLE) ×1 IMPLANT
STAPLER VISISTAT 35W (STAPLE) IMPLANT
STEM FEM PERS 15 +135 (Stem) ×3 IMPLANT
STEM REV STRT PERS 22X135 (Stem) ×3 IMPLANT
STRIP CLOSURE SKIN 1/2X4 (GAUZE/BANDAGES/DRESSINGS) ×2 IMPLANT
SUT BONE WAX W31G (SUTURE) ×3 IMPLANT
SUT MNCRL AB 3-0 PS2 18 (SUTURE) ×3 IMPLANT
SUT STRATAFIX 0 PDS 27 VIOLET (SUTURE) ×3
SUT STRATAFIX PDS+ 0 24IN (SUTURE) ×3 IMPLANT
SUT VIC AB 1 CT1 36 (SUTURE) ×3 IMPLANT
SUTURE STRATFX 0 PDS 27 VIOLET (SUTURE) ×1 IMPLANT
SYR CONTROL 10ML LL (SYRINGE) ×6 IMPLANT
TRAY FOLEY MTR SLVR 16FR STAT (SET/KITS/TRAYS/PACK) ×3 IMPLANT
WATER STERILE IRR 1000ML POUR (IV SOLUTION) ×3 IMPLANT
WEDGE FEM KNEE 11X5 (Joint) ×3 IMPLANT
WEDGE FEM POST PSN REV SZ11 10 (Miscellaneous) ×6 IMPLANT
WRAP KNEE MAXI GEL POST OP (GAUZE/BANDAGES/DRESSINGS) ×3 IMPLANT

## 2020-05-13 NOTE — Anesthesia Postprocedure Evaluation (Signed)
Anesthesia Post Note  Patient: Austin Ayala  Procedure(s) Performed: TOTAL KNEE REVISION (Left Knee)     Patient location during evaluation: PACU Anesthesia Type: Spinal Level of consciousness: awake and alert, patient cooperative and oriented Pain management: pain level controlled Vital Signs Assessment: post-procedure vital signs reviewed and stable Respiratory status: spontaneous breathing, nonlabored ventilation and respiratory function stable Cardiovascular status: blood pressure returned to baseline and stable Postop Assessment: no apparent nausea or vomiting, spinal receding, patient able to bend at knees and adequate PO intake Anesthetic complications: no   No complications documented.  Last Vitals:  Vitals:   05/13/20 1415 05/13/20 1430  BP: 133/65 133/74  Pulse: 66 79  Resp: 13 17  Temp:    SpO2: 100% 95%    Last Pain:  Vitals:   05/13/20 1440  TempSrc:   PainSc: 2                  Austin Ayala,E. Izzak Fries

## 2020-05-13 NOTE — Transfer of Care (Signed)
Immediate Anesthesia Transfer of Care Note  Patient: Austin Ayala  Procedure(s) Performed: TOTAL KNEE REVISION (Left Knee)  Patient Location: PACU  Anesthesia Type:Spinal  Level of Consciousness: awake and alert   Airway & Oxygen Therapy: Patient Spontanous Breathing and Patient connected to face mask oxygen  Post-op Assessment: Report given to RN and Post -op Vital signs reviewed and stable  Post vital signs: Reviewed and stable  Last Vitals:  Vitals Value Taken Time  BP 133/66 05/13/20 1404  Temp    Pulse 74 05/13/20 1406  Resp 16 05/13/20 1406  SpO2 100 % 05/13/20 1406  Vitals shown include unvalidated device data.  Last Pain:  Vitals:   05/13/20 0848  TempSrc: Oral      Patients Stated Pain Goal: 4 (49/44/73 9584)  Complications: No complications documented.

## 2020-05-13 NOTE — H&P (Signed)
Austin Ayala MRN:  [id number] DOB/SEX:  10/21/1934/male  CHIEF COMPLAINT:  Painful left Knee  HISTORY: Patient is a 85 y.o. male presented with a history of pain in the left knee. Onset of symptoms was abrupt starting a few months ago with unchanged course since that time. Patient has been treated conservatively with over-the-counter NSAIDs and activity modification. Patient currently rates pain in the knee at 10 out of 10 with activity. There is pain at night.  PAST MEDICAL HISTORY: Patient Active Problem List   Diagnosis Date Noted  . H/O: hypertension   . H/O herpes zoster   . H/O carpal tunnel syndrome   . Gout, arthropathy   . Elevated prostate specific antigen (PSA)   . Mixed hyperlipidemia 09/28/2019  . Closed wedge compression fracture of L3 vertebra with routine healing 05/24/2019  . Elevated blood pressure reading in office without diagnosis of hypertension 09/26/2018  . Ventral hernia without obstruction or gangrene 08/21/2016  . Benign lipomatous neoplasm of other sites 07/24/2015  . Benign neoplasm of colon 07/24/2015  . Class 1 obesity due to excess calories without serious comorbidity with body mass index (BMI) of 30.0 to 30.9 in adult 07/24/2015  . Glaucoma (increased eye pressure) 07/24/2015  . Malignant neoplasm prostate ([place]) 07/24/2015  . Onychomycosis 07/24/2015  . Osteoarthritis 07/24/2015  . Sleep apnea 07/24/2015   Past Medical History:  Diagnosis Date  . Benign neoplasm of colon 07/24/2015   Formatting of this note might be different from the original. Colon 2014. Stopping.  . Closed wedge compression fracture of L3 vertebra with routine healing 05/24/2019   Formatting of this note might be different from the original. Per pt message 05/24/19  . Elevated prostate specific antigen (PSA)   . Gout, arthropathy   . H/O carpal tunnel syndrome   . H/O herpes zoster   . H/O: hypertension   . Heart murmur   . Hypertension   . Malignant neoplasm prostate ([place])  07/24/2015   Formatting of this note might be different from the original. Dr. Nila Nephew managing.  Twice a year.  . Mixed hyperlipidemia 09/28/2019  . Onychomycosis 07/24/2015  . Osteoarthritis 07/24/2015  . Sleep apnea 07/24/2015   Formatting of this note might be different from the original. Infrequent CPAP  . Ventral hernia without obstruction or gangrene 08/21/2016   Formatting of this note might be different from the original. Diastasis recti.   Past Surgical History:  Procedure Laterality Date  . RADIOACTIVE SEED IMPLANT  2010  . REPLACEMENT TOTAL KNEE BILATERAL    . TONSILLECTOMY       MEDICATIONS:   Medications Prior to Admission  Medication Sig Dispense Refill Last Dose  . acetaminophen (TYLENOL) 500 MG tablet Take 500 mg by mouth 4 (four) times daily.     . Alpha-Lipoic Acid 600 MG CAPS Take 600 mg by mouth daily.     Marland Kitchen ascorbic acid (VITAMIN C) 500 MG tablet Take 500 mg by mouth daily.      Marland Kitchen BLACK PEPPER-TURMERIC PO Take 1 tablet by mouth daily. 5mg  black pepper, 500 mg of turmeric     . cholecalciferol (VITAMIN D3) 25 MCG (1000 UNIT) tablet Take 1,000 Units by mouth daily.     . Coenzyme Q10 (COQ-10) 100 MG CAPS Take 100 mg by mouth daily. With 180 mg of K2     . Copper Gluconate (COPPER CAPS) 2 MG CAPS Take 2 mg by mouth at bedtime.      Marland Kitchen DM-Phenylephrine-Acetaminophen (COLD/FLU DAYTIME  RELIEF PO) Take 1 capsule by mouth daily as needed (congsetion).     . Ginkgo Biloba 120 MG CAPS Take 120 mg by mouth daily.     Marland Kitchen GRAPE SEED EXTRACT PO Take 380 mg by mouth daily.     Marland Kitchen HAWTHORN BERRIES PO Take 100 mg by mouth daily.     Marland Kitchen latanoprost (XALATAN) 0.005 % ophthalmic solution Place 1 drop into both eyes at bedtime.     Marland Kitchen losartan (COZAAR) 50 MG tablet Take 1 tablet (50 mg total) by mouth daily. (Patient taking differently: Take 25-50 mg by mouth See admin instructions. Take 50 mg in the morning and 25 mg at bedtime) 90 tablet 3   . LYCOPENE PO Take 40 mg by mouth daily.     .  Multiple Vitamins-Minerals (CORAL CALCIUM PLUS PO) Take 1 tablet by mouth daily.     . Multiple Vitamins-Minerals (LUTEIN-ZEAXANTHIN PO) Take 1 tablet by mouth daily.     . Multiple Vitamins-Minerals (ZINC PO) Take 22 mg by mouth daily.     Marland Kitchen OVER THE COUNTER MEDICATION Take 1 tablet by mouth daily. Nitro-oxide otc supplement     . OVER THE COUNTER MEDICATION Take 1 tablet by mouth at bedtime. CBD with melatonin     . vitamin B-12 (CYANOCOBALAMIN) 500 MCG tablet Take 500 mcg by mouth daily.     . Multiple Vitamin (MULTIVITAMIN) capsule Take 1 capsule by mouth daily.     . Multiple Vitamins-Minerals (PRESERVISION AREDS 2+MULTI VIT PO) Take 1 tablet by mouth 2 (two) times daily.      . vitamin E 180 MG (400 UNITS) capsule Take 400 Units by mouth daily.        ALLERGIES:   Allergies  Allergen Reactions  . Doxycycline Hyclate     Dizziness (intolerance)    REVIEW OF SYSTEMS:  A comprehensive review of systems was negative except for: Musculoskeletal: positive for arthralgias, bone pain and myalgias   FAMILY HISTORY:   Family History  Problem Relation Age of Onset  . Melanoma Brother   . Heart disease Brother   . Heart failure Mother   . Hypertension Mother   . Heart disease Brother   . Melanoma Brother   . Alzheimer's disease Brother     SOCIAL HISTORY:   Social History   Tobacco Use  . Smoking status: Never Smoker  . Smokeless tobacco: Never Used  Substance Use Topics  . Alcohol use: Never     EXAMINATION:  Vital signs in last 24 hours: Temp:  [98 F (36.7 C)] 98 F (36.7 C) (11/01 0848) Pulse Rate:  [64] 64 (11/01 0848) Resp:  [15] 15 (11/01 0848) BP: (155)/(82) 155/82 (11/01 0848) SpO2:  [100 %] 100 % (11/01 0848)  BP (!) 155/82   Pulse 64   Temp 98 F (36.7 C) (Oral)   Resp 15   SpO2 100%   General Appearance:    Alert, cooperative, no distress, appears stated age  Head:    Normocephalic, without obvious abnormality, atraumatic  Eyes:    PERRL,  conjunctiva/corneas clear, EOM's intact, fundi    benign, both eyes       Ears:    Normal TM's and external ear canals, both ears  Nose:   Nares normal, septum midline, mucosa normal, no drainage    or sinus tenderness  Throat:   Lips, mucosa, and tongue normal; teeth and gums normal  Neck:   Supple, symmetrical, trachea midline, no adenopathy;  thyroid:  No enlargement/tenderness/nodules; no carotid   bruit or JVD  Back:     Symmetric, no curvature, ROM normal, no CVA tenderness  Lungs:     Clear to auscultation bilaterally, respirations unlabored  Chest wall:    No tenderness or deformity  Heart:    Regular rate and rhythm, S1 and S2 normal, no murmur, rub   or gallop  Abdomen:     Soft, non-tender, bowel sounds active all four quadrants,    no masses, no organomegaly  Genitalia:    Normal male without lesion, discharge or tenderness  Rectal:    Normal tone, normal prostate, no masses or tenderness;   guaiac negative stool  Extremities:   Extremities normal, atraumatic, no cyanosis or edema  Pulses:   2+ and symmetric all extremities  Skin:   Skin color, texture, turgor normal, no rashes or lesions  Lymph nodes:   Cervical, supraclavicular, and axillary nodes normal  Neurologic:   CNII-XII intact. Normal strength, sensation and reflexes      throughout    Musculoskeletal:  ROM 0-120, Ligaments intact,  Imaging Review Plain radiographs demonstrate failed/loose total knee replacement of the left knee. The bone quality appears to be good for age and reported activity level.  Assessment/Plan: Loosening/failed total knee replacement  The patient history, physical examination and imaging studies are consistent with loosening/failed total knee replacement of the left knee. The patient has failed conservative treatment.  The clearance notes were reviewed.  After discussion with the patient it was felt that Total Knee Revision was indicated. The procedure,  risks, and benefits of total  knee revision were presented and reviewed. The risks including but not limited to aseptic loosening, infection, blood clots, vascular injury, stiffness, patella tracking problems complications among others were discussed. The patient acknowledged the explanation, agreed to proceed with the plan.  Preoperative templating of the joint replacement has been completed, documented, and submitted to the Operating Room personnel in order to optimize intra-operative equipment management.    Patient's anticipated LOS is less than 2 midnights, meeting these requirements: - Lives within 1 hour of care - Has a competent adult at home to recover with post-op recover - NO history of  - Chronic pain requiring opiods  - Diabetes  - Coronary Artery Disease  - Heart failure  - Heart attack  - Stroke  - DVT/VTE  - Cardiac arrhythmia  - Respiratory Failure/COPD  - Renal failure  - Anemia  - Advanced Liver disease       Donia Ast 05/13/2020, 8:56 AM

## 2020-05-13 NOTE — Op Note (Signed)
TOTAL KNEE REPLACEMENT OPERATIVE NOTE:  05/13/2020  2:10 PM  PATIENT:  Austin Ayala  85 y.o. male  PRE-OPERATIVE DIAGNOSIS:  LEFT knee loosening, failed total knee arthroplasty T84.019A  POST-OPERATIVE DIAGNOSIS:  LEFT knee loosening, failed total knee arthroplasty T84.019A  PROCEDURE:  Procedure(s): TOTAL KNEE REVISION  SURGEON:  Surgeon(s): Vickey Huger, MD  PHYSICIAN ASSISTANT: Carlyon Shadow, PA-C   ANESTHESIA:   spinal  SPECIMEN: None  COUNTS:  Correct  TOURNIQUET:   Total Tourniquet Time Documented: Thigh (Right) - 84 minutes Total: Thigh (Right) - 84 minutes   DICTATION:  Indication for procedure:    The patient is a 85 y.o. male who has failed conservative treatment for LEFT knee loosening, failed total knee arthroplasty T84.019A.  Informed consent was obtained prior to anesthesia. The risks versus benefits of the operation were explain and in a way the patient can, and did, understand.     Description of procedure:                The patient was taken to the operating room and placed under anesthesia. The patient was positioned in the usual fashion taking care that all body parts were adequately padded and/or protected. A tourniquet was applied and the leg prepped and draped in the usual sterile fashion. The extremity was exsanguinated with the esmarch and tourniquet inflated to 300 mmHg. Pre-operative range of motion was -5to 110 degrees with a large joint effusion.  A midline incision approximately 6-7 Ayala long was made with a #10 blade. A new blade was used to make a parapatellar arthrotomy going 2-3 cm into the quadriceps tendon, over the patella, and alongside the medial aspect of the patellar tendon. A synovectomy was then performed with the #10 blade and forceps. I then elevated the deep MCL off the medial tibial metaphysis subperiosteally around to the semimembranosus attachment.    I everted the patella anddebrided it and foundno problem  with the prior replacement.  A homan retractor was place to retract and protect the patella and lateral structures. A Z-retractor was placedmedially to protect the medial structures. The femoral component had gross loosening and was removed with 2 fingers and no issues. I then used a large rongeur and curette to remove cement and debride down to bone. There was mildbone loss on the lateral  distal femur and moderate bone loss on both posterior condyles.I used a starter reamer on power to ream the femoral canal. I then reamed by hand up to a 59mm by 120mm length stem and it fit wellwith no offset. A size 99femur with70mm bilateral posterior and 78mm distal lateral augments fit well.  The tibia was delivered forward in deep flexion and external rotation.I removed the tibial component with a micro e saw, revision osteotome, and bone tamp. Cement was removed with a quarter inch osteotome and a harrison rongeur.I used a starter reamer on power to ream the tibial canal. I then reamed by hand up to a 72mm by 119mm length stem and it fit well.A sizeHtray was selected and pinned into place centered on the medial 1/3 of the tibial tubercle. The reamer and keel were used to prepare the tibia through the tray.No Augments were needed.  A lamina spreader was placed in 90 degrees of flexion. A71mm spacer blocked was found to offer good flexion and extension gap balance after minimal in degreereleasing.    I then trialed with the size34femur, sizeHtibia, a61mm insert. I had excellent flexion/extension gap balance, excellent patella tracking. Flexion  was full and beyond 100 degrees; extension was zero. These components were chosen and the staff opened them to me on the back table while the knee was lavaged copiously and the cement mixed.    The soft tissue was infiltrated with 60cc of exparel 1.3% through a 21 gauge needle.  I cemented in the components and removed all excess  cement.  The polyethylene tibial component was snapped into place and the knee placed in extension while cement was hardening.  The capsule was infilltrated with a 60cc exparel/marcaine/saline mixture.   Once the cement was hard, the tourniquet was let down.  Hemostasis was obtained.  The arthrotomy was closed using a #1 stratofix running suture.  The deep soft tissues were closed with #0 vicryls and the subcuticular layer closed with #2-0 vicryl.  The skin was reapproximated and closed with staples.  The patient was then awakened, extubated, and taken to the recovery room in stable condition.  BLOOD LOSS:  670LI COMPLICATIONS:  None.  PLAN OF CARE: Discharge to home after PACU  PATIENT DISPOSITION:  PACU - hemodynamically stable.    Please fax a copy of this op note to my office at [phone] (please only include page 1 and 2 of the Case Information op note)

## 2020-05-13 NOTE — Anesthesia Procedure Notes (Signed)
Date/Time: 05/13/2020 11:32 AM Performed by: Cynda Familia, CRNA Pre-anesthesia Checklist: Patient identified, Emergency Drugs available, Suction available, Patient being monitored and Timeout performed Patient Re-evaluated:Patient Re-evaluated prior to induction Oxygen Delivery Method: Simple face mask Placement Confirmation: positive ETCO2 and breath sounds checked- equal and bilateral Dental Injury: Teeth and Oropharynx as per pre-operative assessment

## 2020-05-13 NOTE — Progress Notes (Signed)
Assisted Dr. Carswell Jackson with left, ultrasound guided, adductor canal block. Side rails up, monitors on throughout procedure. See vital signs in flow sheet. Tolerated Procedure well.  

## 2020-05-13 NOTE — Evaluation (Signed)
Physical Therapy Evaluation Patient Details Name: Austin Ayala MRN: [id number] DOB: January 05, 1935 Today's Date: 05/13/2020   History of Present Illness  Patient is an 85 y.o. male s/p Lt TKR on 05/13/20 with PMH significant for OA, HTN, HLD, gout, L3 compression fracture, Bil TKA's.    Clinical Impression  Austin Ayala is a 85 y.o. male POD 0 s/p Lt TKR. Patient reports independence with mobility at baseline. Patient is now limited by functional impairments (see PT problem list below) and requires min guard/supervision for transfers and gait with RW. Patient was able to ambulate ~120 feet with RW and min guard/supervision and cues for safe walker management. Patient instructed in exercises to facilitate ROM and circulation. Patient will benefit from continued skilled PT interventions to address impairments and progress towards PLOF. Patient has met mobility goals at adequate level for discharge home; will continue to follow if pt continues acute stay to progress towards Mod I goals.     Follow Up Recommendations Follow surgeon's recommendation for DC plan and follow-up therapies;Home health PT    Equipment Recommendations  None recommended by PT    Recommendations for Other Services       Precautions / Restrictions Precautions Precautions: Fall Restrictions Weight Bearing Restrictions: No Other Position/Activity Restrictions: WBAT      Mobility  Bed Mobility Overal bed mobility: Needs Assistance Bed Mobility: Supine to Sit;Sit to Supine     Supine to sit: Supervision;HOB elevated Sit to supine: Supervision;HOB elevated   General bed mobility comments: pt has adjustable bed frame so HOB elevated, no assist required to pivot to EOB.    Transfers Overall transfer level: Needs assistance Equipment used: Rolling walker (2 wheeled) Transfers: Sit to/from Stand Sit to Stand: Min guard;Supervision         General transfer comment: cues for safe hand placement on RW and for safe  reach back to EOB to sit. No assist required for power up.  Ambulation/Gait Ambulation/Gait assistance: Min guard;Supervision Gait Distance (Feet): 120 Feet Assistive device: Rolling walker (2 wheeled) Gait Pattern/deviations: Step-to pattern;Decreased stride length;Decreased stance time - left;Decreased weight shift to left Gait velocity: decr   General Gait Details: cues for safe step pattern and proximity/positioning of RW. no overt LOB noted and no buckling at Lt knee.   Stairs            Wheelchair Mobility    Modified Rankin (Stroke Patients Only)       Balance Overall balance assessment: Needs assistance Sitting-balance support: Feet supported Sitting balance-Leahy Scale: Good     Standing balance support: During functional activity;Bilateral upper extremity supported Standing balance-Leahy Scale: Fair                               Pertinent Vitals/Pain Pain Assessment: 0-10 Pain Score: 4  Pain Location: Lt knee Pain Descriptors / Indicators: Aching Pain Intervention(s): Limited activity within patient's tolerance;Monitored during session;Repositioned    Home Living Family/patient expects to be discharged to:: Private residence Living Arrangements: Spouse/significant other Available Help at Discharge: Family Type of Home: House Home Access: Level entry     Home Layout: Two level;Bed/bath upstairs Home Equipment: Environmental consultant - 2 wheels;Shower seat - built in;Grab bars - tub/shower;Grab bars - toilet;Cane - single point;Wheelchair - manual      Prior Function Level of Independence: Independent               Hand Dominance   Dominant Hand:  Right    Extremity/Trunk Assessment   Upper Extremity Assessment Upper Extremity Assessment: Overall WFL for tasks assessed    Lower Extremity Assessment Lower Extremity Assessment: LLE deficits/detail LLE Deficits / Details: good quad activaiton, no extensor lag with SLR LLE Sensation:  WNL LLE Coordination: WNL    Cervical / Trunk Assessment Cervical / Trunk Assessment: Normal  Communication   Communication: No difficulties  Cognition Arousal/Alertness: Awake/alert Behavior During Therapy: WFL for tasks assessed/performed Overall Cognitive Status: Within Functional Limits for tasks assessed                                        General Comments      Exercises Total Joint Exercises Ankle Circles/Pumps: AROM;Both;20 reps;Supine Quad Sets: AROM;Left;5 reps;Supine Short Arc Quad: AROM;Left;5 reps;Supine Heel Slides: AROM;Left;5 reps;Supine Hip ABduction/ADduction: AROM;Left;5 reps;Supine Straight Leg Raises: AROM;Left;5 reps;Supine   Assessment/Plan    PT Assessment Patient needs continued PT services  PT Problem List Decreased strength;Decreased range of motion;Decreased activity tolerance;Decreased balance;Decreased mobility;Decreased knowledge of use of DME;Decreased knowledge of precautions;Pain       PT Treatment Interventions DME instruction;Gait training;Stair training;Functional mobility training;Therapeutic activities;Therapeutic exercise;Balance training;Patient/family education    PT Goals (Current goals can be found in the Care Plan section)  Acute Rehab PT Goals Patient Stated Goal: get home today PT Goal Formulation: With patient Time For Goal Achievement: 05/20/20 Potential to Achieve Goals: Good    Frequency 7X/week   Barriers to discharge        Co-evaluation               AM-PAC PT "6 Clicks" Mobility  Outcome Measure Help needed turning from your back to your side while in a flat bed without using bedrails?: None Help needed moving from lying on your back to sitting on the side of a flat bed without using bedrails?: None Help needed moving to and from a bed to a chair (including a wheelchair)?: A Little Help needed standing up from a chair using your arms (e.g., wheelchair or bedside chair)?: A  Little Help needed to walk in hospital room?: A Little Help needed climbing 3-5 steps with a railing? : A Little 6 Click Score: 20    End of Session Equipment Utilized During Treatment: Gait belt Activity Tolerance: Patient tolerated treatment well Patient left: in bed;with call bell/phone within reach Nurse Communication: Mobility status PT Visit Diagnosis: Muscle weakness (generalized) (M62.81);Difficulty in walking, not elsewhere classified (R26.2)    Time: 8882-8003 PT Time Calculation (min) (ACUTE ONLY): 39 min   Charges:   PT Evaluation $PT Eval Low Complexity: 1 Low PT Treatments $Gait Training: 8-22 mins $Therapeutic Exercise: 8-22 mins       Verner Mould, DPT Acute Rehabilitation Services  Office [phone] Pager [phone]  05/13/2020 5:38 PM

## 2020-05-13 NOTE — Anesthesia Preprocedure Evaluation (Addendum)
Anesthesia Evaluation  Patient identified by MRN, date of birth, ID band Patient awake    Reviewed: Allergy & Precautions, NPO status , Patient's Chart, lab work & pertinent test results  History of Anesthesia Complications Negative for: history of anesthetic complications  Airway Mallampati: I  TM Distance: >3 FB Neck ROM: Full    Dental  (+) Edentulous Upper, Edentulous Lower   Pulmonary sleep apnea (no longer requires CPAP) ,  05/09/2020 SARS coronavirus NEG   breath sounds clear to auscultation       Cardiovascular hypertension, Pt. on medications (-) angina Rhythm:Regular Rate:Normal     Neuro/Psych negative neurological ROS     GI/Hepatic negative GI ROS, Neg liver ROS,   Endo/Other  obese  Renal/GU negative Renal ROS   Prostate cancer    Musculoskeletal  (+) Arthritis , Osteoarthritis,    Abdominal (+) + obese,   Peds  Hematology negative hematology ROS (+)   Anesthesia Other Findings   Reproductive/Obstetrics                            Anesthesia Physical Anesthesia Plan  ASA: III  Anesthesia Plan: Spinal   Post-op Pain Management:  Regional for Post-op pain   Induction:   PONV Risk Score and Plan: 1 and Ondansetron and Treatment may vary due to age or medical condition  Airway Management Planned: Natural Airway and Simple Face Mask  Additional Equipment: None  Intra-op Plan:   Post-operative Plan:   Informed Consent: I have reviewed the patients History and Physical, chart, labs and discussed the procedure including the risks, benefits and alternatives for the proposed anesthesia with the patient or authorized representative who has indicated his/her understanding and acceptance.       Plan Discussed with: CRNA and Surgeon  Anesthesia Plan Comments: (Plan routine monitors, SAB with adductor canal block for post op analgesia)       Anesthesia Quick  Evaluation

## 2020-05-13 NOTE — Anesthesia Procedure Notes (Signed)
Spinal  Patient location during procedure: OR End time: 05/13/2020 11:46 AM Staffing Performed: resident/CRNA  Resident/CRNA: Cynda Familia, CRNA Preanesthetic Checklist Completed: patient identified, IV checked, site marked, risks and benefits discussed, surgical consent, monitors and equipment checked, pre-op evaluation and timeout performed Spinal Block Patient position: sitting Prep: DuraPrep and site prepped and draped Patient monitoring: heart rate, continuous pulse ox, blood pressure and cardiac monitor Approach: midline Location: L3-4 Injection technique: single-shot Needle Needle type: Sprotte  Needle gauge: 24 G Assessment Sensory level: T4 Additional Notes Expiration date of tray noted and within date.   Patient tolerated procedure well. Dr Glennon Mac present assisted and supervised procedure- prep dry at time of insertion

## 2020-05-13 NOTE — Anesthesia Procedure Notes (Signed)
Anesthesia Regional Block: Adductor canal block   Pre-Anesthetic Checklist: ,, timeout performed, Correct Patient, Correct Site, Correct Laterality, Correct Procedure, Correct Position, site marked, Risks and benefits discussed,  Surgical consent,  Pre-op evaluation,  At surgeon's request and post-op pain management  Laterality: Left and Lower  Prep: chloraprep       Needles:  Injection technique: Single-shot  Needle Type: Echogenic Needle     Needle Length: 9cm  Needle Gauge: 21     Additional Needles:   Procedures:,,,, ultrasound used (permanent image in chart),,,,  Narrative:  Start time: 05/13/2020 11:51 AM End time: 05/13/2020 10:57 AM Injection made incrementally with aspirations every 5 mL.  Performed by: Personally  Anesthesiologist: Annye Asa, MD  Additional Notes: Pt identified in Holding room.  Monitors applied. Working IV access confirmed. Sterile prep L thigh.  #21ga ECHOgenic Arrow block needle into adductor canal with US guidance.  20cc 0.75% Ropivacaine injected incrementally after negative test dose.  Patient asymptomatic, VSS, no heme aspirated, tolerated well.  [name], MD

## 2020-05-15 ENCOUNTER — Encounter (HOSPITAL_COMMUNITY): Payer: Self-pay | Admitting: Orthopedic Surgery

## 2020-05-20 DIAGNOSIS — M62552 Muscle wasting and atrophy, not elsewhere classified, left thigh: Secondary | ICD-10-CM | POA: Diagnosis not present

## 2020-05-20 DIAGNOSIS — Z96652 Presence of left artificial knee joint: Secondary | ICD-10-CM | POA: Diagnosis not present

## 2020-05-20 DIAGNOSIS — M25462 Effusion, left knee: Secondary | ICD-10-CM | POA: Diagnosis not present

## 2020-05-20 DIAGNOSIS — M25562 Pain in left knee: Secondary | ICD-10-CM | POA: Diagnosis not present

## 2020-05-20 DIAGNOSIS — R2689 Other abnormalities of gait and mobility: Secondary | ICD-10-CM | POA: Diagnosis not present

## 2020-05-22 DIAGNOSIS — M25462 Effusion, left knee: Secondary | ICD-10-CM | POA: Diagnosis not present

## 2020-05-22 DIAGNOSIS — M62552 Muscle wasting and atrophy, not elsewhere classified, left thigh: Secondary | ICD-10-CM | POA: Diagnosis not present

## 2020-05-22 DIAGNOSIS — Z96652 Presence of left artificial knee joint: Secondary | ICD-10-CM | POA: Diagnosis not present

## 2020-05-22 DIAGNOSIS — R2689 Other abnormalities of gait and mobility: Secondary | ICD-10-CM | POA: Diagnosis not present

## 2020-05-22 DIAGNOSIS — M25562 Pain in left knee: Secondary | ICD-10-CM | POA: Diagnosis not present

## 2020-05-23 DIAGNOSIS — Z96652 Presence of left artificial knee joint: Secondary | ICD-10-CM | POA: Diagnosis not present

## 2020-05-23 DIAGNOSIS — M25562 Pain in left knee: Secondary | ICD-10-CM | POA: Diagnosis not present

## 2020-05-23 DIAGNOSIS — M25462 Effusion, left knee: Secondary | ICD-10-CM | POA: Diagnosis not present

## 2020-05-23 DIAGNOSIS — Z471 Aftercare following joint replacement surgery: Secondary | ICD-10-CM | POA: Diagnosis not present

## 2020-05-24 DIAGNOSIS — Z96652 Presence of left artificial knee joint: Secondary | ICD-10-CM | POA: Diagnosis not present

## 2020-05-24 DIAGNOSIS — M25562 Pain in left knee: Secondary | ICD-10-CM | POA: Diagnosis not present

## 2020-05-24 DIAGNOSIS — M25462 Effusion, left knee: Secondary | ICD-10-CM | POA: Diagnosis not present

## 2020-05-24 DIAGNOSIS — R2689 Other abnormalities of gait and mobility: Secondary | ICD-10-CM | POA: Diagnosis not present

## 2020-05-24 DIAGNOSIS — M62552 Muscle wasting and atrophy, not elsewhere classified, left thigh: Secondary | ICD-10-CM | POA: Diagnosis not present

## 2020-05-27 DIAGNOSIS — M62552 Muscle wasting and atrophy, not elsewhere classified, left thigh: Secondary | ICD-10-CM | POA: Diagnosis not present

## 2020-05-27 DIAGNOSIS — M25562 Pain in left knee: Secondary | ICD-10-CM | POA: Diagnosis not present

## 2020-05-27 DIAGNOSIS — R2689 Other abnormalities of gait and mobility: Secondary | ICD-10-CM | POA: Diagnosis not present

## 2020-05-27 DIAGNOSIS — M25462 Effusion, left knee: Secondary | ICD-10-CM | POA: Diagnosis not present

## 2020-05-27 DIAGNOSIS — Z96652 Presence of left artificial knee joint: Secondary | ICD-10-CM | POA: Diagnosis not present

## 2020-05-29 DIAGNOSIS — R2689 Other abnormalities of gait and mobility: Secondary | ICD-10-CM | POA: Diagnosis not present

## 2020-05-29 DIAGNOSIS — Z96652 Presence of left artificial knee joint: Secondary | ICD-10-CM | POA: Diagnosis not present

## 2020-05-29 DIAGNOSIS — M25462 Effusion, left knee: Secondary | ICD-10-CM | POA: Diagnosis not present

## 2020-05-29 DIAGNOSIS — M25562 Pain in left knee: Secondary | ICD-10-CM | POA: Diagnosis not present

## 2020-05-29 DIAGNOSIS — M62552 Muscle wasting and atrophy, not elsewhere classified, left thigh: Secondary | ICD-10-CM | POA: Diagnosis not present

## 2020-05-30 DIAGNOSIS — Z96652 Presence of left artificial knee joint: Secondary | ICD-10-CM | POA: Diagnosis not present

## 2020-05-30 DIAGNOSIS — M62552 Muscle wasting and atrophy, not elsewhere classified, left thigh: Secondary | ICD-10-CM | POA: Diagnosis not present

## 2020-05-30 DIAGNOSIS — M25562 Pain in left knee: Secondary | ICD-10-CM | POA: Diagnosis not present

## 2020-05-30 DIAGNOSIS — R2689 Other abnormalities of gait and mobility: Secondary | ICD-10-CM | POA: Diagnosis not present

## 2020-05-30 DIAGNOSIS — M25462 Effusion, left knee: Secondary | ICD-10-CM | POA: Diagnosis not present

## 2020-06-03 DIAGNOSIS — M25562 Pain in left knee: Secondary | ICD-10-CM | POA: Diagnosis not present

## 2020-06-03 DIAGNOSIS — R2689 Other abnormalities of gait and mobility: Secondary | ICD-10-CM | POA: Diagnosis not present

## 2020-06-03 DIAGNOSIS — Z96652 Presence of left artificial knee joint: Secondary | ICD-10-CM | POA: Diagnosis not present

## 2020-06-03 DIAGNOSIS — M25462 Effusion, left knee: Secondary | ICD-10-CM | POA: Diagnosis not present

## 2020-06-03 DIAGNOSIS — M62552 Muscle wasting and atrophy, not elsewhere classified, left thigh: Secondary | ICD-10-CM | POA: Diagnosis not present

## 2020-06-05 DIAGNOSIS — R2689 Other abnormalities of gait and mobility: Secondary | ICD-10-CM | POA: Diagnosis not present

## 2020-06-05 DIAGNOSIS — M25462 Effusion, left knee: Secondary | ICD-10-CM | POA: Diagnosis not present

## 2020-06-05 DIAGNOSIS — M25562 Pain in left knee: Secondary | ICD-10-CM | POA: Diagnosis not present

## 2020-06-05 DIAGNOSIS — M62552 Muscle wasting and atrophy, not elsewhere classified, left thigh: Secondary | ICD-10-CM | POA: Diagnosis not present

## 2020-06-05 DIAGNOSIS — Z96652 Presence of left artificial knee joint: Secondary | ICD-10-CM | POA: Diagnosis not present

## 2020-06-10 DIAGNOSIS — M62552 Muscle wasting and atrophy, not elsewhere classified, left thigh: Secondary | ICD-10-CM | POA: Diagnosis not present

## 2020-06-10 DIAGNOSIS — R2689 Other abnormalities of gait and mobility: Secondary | ICD-10-CM | POA: Diagnosis not present

## 2020-06-10 DIAGNOSIS — M25562 Pain in left knee: Secondary | ICD-10-CM | POA: Diagnosis not present

## 2020-06-10 DIAGNOSIS — M25462 Effusion, left knee: Secondary | ICD-10-CM | POA: Diagnosis not present

## 2020-06-10 DIAGNOSIS — Z96652 Presence of left artificial knee joint: Secondary | ICD-10-CM | POA: Diagnosis not present

## 2020-06-12 DIAGNOSIS — R2689 Other abnormalities of gait and mobility: Secondary | ICD-10-CM | POA: Diagnosis not present

## 2020-06-12 DIAGNOSIS — Z96652 Presence of left artificial knee joint: Secondary | ICD-10-CM | POA: Diagnosis not present

## 2020-06-12 DIAGNOSIS — M25562 Pain in left knee: Secondary | ICD-10-CM | POA: Diagnosis not present

## 2020-06-12 DIAGNOSIS — M62552 Muscle wasting and atrophy, not elsewhere classified, left thigh: Secondary | ICD-10-CM | POA: Diagnosis not present

## 2020-06-12 DIAGNOSIS — M25462 Effusion, left knee: Secondary | ICD-10-CM | POA: Diagnosis not present

## 2020-06-13 DIAGNOSIS — Z96652 Presence of left artificial knee joint: Secondary | ICD-10-CM | POA: Diagnosis not present

## 2020-06-13 DIAGNOSIS — M25462 Effusion, left knee: Secondary | ICD-10-CM | POA: Diagnosis not present

## 2020-06-13 DIAGNOSIS — M62552 Muscle wasting and atrophy, not elsewhere classified, left thigh: Secondary | ICD-10-CM | POA: Diagnosis not present

## 2020-06-13 DIAGNOSIS — M25562 Pain in left knee: Secondary | ICD-10-CM | POA: Diagnosis not present

## 2020-06-13 DIAGNOSIS — R2689 Other abnormalities of gait and mobility: Secondary | ICD-10-CM | POA: Diagnosis not present

## 2020-06-17 DIAGNOSIS — R2689 Other abnormalities of gait and mobility: Secondary | ICD-10-CM | POA: Diagnosis not present

## 2020-06-17 DIAGNOSIS — M25562 Pain in left knee: Secondary | ICD-10-CM | POA: Diagnosis not present

## 2020-06-17 DIAGNOSIS — M62552 Muscle wasting and atrophy, not elsewhere classified, left thigh: Secondary | ICD-10-CM | POA: Diagnosis not present

## 2020-06-17 DIAGNOSIS — M25462 Effusion, left knee: Secondary | ICD-10-CM | POA: Diagnosis not present

## 2020-06-17 DIAGNOSIS — Z96652 Presence of left artificial knee joint: Secondary | ICD-10-CM | POA: Diagnosis not present

## 2020-06-19 DIAGNOSIS — M25562 Pain in left knee: Secondary | ICD-10-CM | POA: Diagnosis not present

## 2020-06-19 DIAGNOSIS — M62552 Muscle wasting and atrophy, not elsewhere classified, left thigh: Secondary | ICD-10-CM | POA: Diagnosis not present

## 2020-06-19 DIAGNOSIS — R2689 Other abnormalities of gait and mobility: Secondary | ICD-10-CM | POA: Diagnosis not present

## 2020-06-19 DIAGNOSIS — Z96652 Presence of left artificial knee joint: Secondary | ICD-10-CM | POA: Diagnosis not present

## 2020-06-19 DIAGNOSIS — M25462 Effusion, left knee: Secondary | ICD-10-CM | POA: Diagnosis not present

## 2020-06-21 DIAGNOSIS — M62552 Muscle wasting and atrophy, not elsewhere classified, left thigh: Secondary | ICD-10-CM | POA: Diagnosis not present

## 2020-06-21 DIAGNOSIS — Z96652 Presence of left artificial knee joint: Secondary | ICD-10-CM | POA: Diagnosis not present

## 2020-06-21 DIAGNOSIS — M25562 Pain in left knee: Secondary | ICD-10-CM | POA: Diagnosis not present

## 2020-06-21 DIAGNOSIS — R2689 Other abnormalities of gait and mobility: Secondary | ICD-10-CM | POA: Diagnosis not present

## 2020-06-21 DIAGNOSIS — M25462 Effusion, left knee: Secondary | ICD-10-CM | POA: Diagnosis not present

## 2020-06-25 DIAGNOSIS — Z96652 Presence of left artificial knee joint: Secondary | ICD-10-CM | POA: Diagnosis not present

## 2020-06-25 DIAGNOSIS — R2689 Other abnormalities of gait and mobility: Secondary | ICD-10-CM | POA: Diagnosis not present

## 2020-06-25 DIAGNOSIS — M25562 Pain in left knee: Secondary | ICD-10-CM | POA: Diagnosis not present

## 2020-06-25 DIAGNOSIS — M25462 Effusion, left knee: Secondary | ICD-10-CM | POA: Diagnosis not present

## 2020-06-25 DIAGNOSIS — M62552 Muscle wasting and atrophy, not elsewhere classified, left thigh: Secondary | ICD-10-CM | POA: Diagnosis not present

## 2020-06-27 DIAGNOSIS — Z96652 Presence of left artificial knee joint: Secondary | ICD-10-CM | POA: Diagnosis not present

## 2020-06-27 DIAGNOSIS — M25562 Pain in left knee: Secondary | ICD-10-CM | POA: Diagnosis not present

## 2020-06-27 DIAGNOSIS — M25462 Effusion, left knee: Secondary | ICD-10-CM | POA: Diagnosis not present

## 2020-06-27 DIAGNOSIS — R2689 Other abnormalities of gait and mobility: Secondary | ICD-10-CM | POA: Diagnosis not present

## 2020-06-27 DIAGNOSIS — M62552 Muscle wasting and atrophy, not elsewhere classified, left thigh: Secondary | ICD-10-CM | POA: Diagnosis not present

## 2020-07-01 DIAGNOSIS — M62552 Muscle wasting and atrophy, not elsewhere classified, left thigh: Secondary | ICD-10-CM | POA: Diagnosis not present

## 2020-07-01 DIAGNOSIS — R2689 Other abnormalities of gait and mobility: Secondary | ICD-10-CM | POA: Diagnosis not present

## 2020-07-01 DIAGNOSIS — M25562 Pain in left knee: Secondary | ICD-10-CM | POA: Diagnosis not present

## 2020-07-01 DIAGNOSIS — M25462 Effusion, left knee: Secondary | ICD-10-CM | POA: Diagnosis not present

## 2020-07-01 DIAGNOSIS — Z96652 Presence of left artificial knee joint: Secondary | ICD-10-CM | POA: Diagnosis not present

## 2020-07-03 DIAGNOSIS — R2689 Other abnormalities of gait and mobility: Secondary | ICD-10-CM | POA: Diagnosis not present

## 2020-07-03 DIAGNOSIS — M62552 Muscle wasting and atrophy, not elsewhere classified, left thigh: Secondary | ICD-10-CM | POA: Diagnosis not present

## 2020-07-03 DIAGNOSIS — M25562 Pain in left knee: Secondary | ICD-10-CM | POA: Diagnosis not present

## 2020-07-03 DIAGNOSIS — Z96652 Presence of left artificial knee joint: Secondary | ICD-10-CM | POA: Diagnosis not present

## 2020-07-03 DIAGNOSIS — M25462 Effusion, left knee: Secondary | ICD-10-CM | POA: Diagnosis not present

## 2020-07-09 DIAGNOSIS — Z96652 Presence of left artificial knee joint: Secondary | ICD-10-CM | POA: Diagnosis not present

## 2020-07-09 DIAGNOSIS — M62552 Muscle wasting and atrophy, not elsewhere classified, left thigh: Secondary | ICD-10-CM | POA: Diagnosis not present

## 2020-07-09 DIAGNOSIS — M25562 Pain in left knee: Secondary | ICD-10-CM | POA: Diagnosis not present

## 2020-07-09 DIAGNOSIS — M25462 Effusion, left knee: Secondary | ICD-10-CM | POA: Diagnosis not present

## 2020-07-09 DIAGNOSIS — R2689 Other abnormalities of gait and mobility: Secondary | ICD-10-CM | POA: Diagnosis not present

## 2020-07-11 DIAGNOSIS — R2689 Other abnormalities of gait and mobility: Secondary | ICD-10-CM | POA: Diagnosis not present

## 2020-07-11 DIAGNOSIS — M62552 Muscle wasting and atrophy, not elsewhere classified, left thigh: Secondary | ICD-10-CM | POA: Diagnosis not present

## 2020-07-11 DIAGNOSIS — Z96652 Presence of left artificial knee joint: Secondary | ICD-10-CM | POA: Diagnosis not present

## 2020-07-11 DIAGNOSIS — M25462 Effusion, left knee: Secondary | ICD-10-CM | POA: Diagnosis not present

## 2020-07-11 DIAGNOSIS — M25562 Pain in left knee: Secondary | ICD-10-CM | POA: Diagnosis not present

## 2020-10-04 DIAGNOSIS — H401121 Primary open-angle glaucoma, left eye, mild stage: Secondary | ICD-10-CM | POA: Diagnosis not present

## 2020-10-09 DIAGNOSIS — I10 Essential (primary) hypertension: Secondary | ICD-10-CM | POA: Diagnosis not present

## 2020-10-09 DIAGNOSIS — M1712 Unilateral primary osteoarthritis, left knee: Secondary | ICD-10-CM | POA: Diagnosis not present

## 2020-10-09 DIAGNOSIS — G4733 Obstructive sleep apnea (adult) (pediatric): Secondary | ICD-10-CM | POA: Diagnosis not present

## 2020-10-09 DIAGNOSIS — Z0000 Encounter for general adult medical examination without abnormal findings: Secondary | ICD-10-CM | POA: Diagnosis not present

## 2020-10-09 DIAGNOSIS — Z79899 Other long term (current) drug therapy: Secondary | ICD-10-CM | POA: Diagnosis not present

## 2020-10-09 DIAGNOSIS — D126 Benign neoplasm of colon, unspecified: Secondary | ICD-10-CM | POA: Diagnosis not present

## 2020-10-09 DIAGNOSIS — C61 Malignant neoplasm of prostate: Secondary | ICD-10-CM | POA: Diagnosis not present

## 2020-10-09 DIAGNOSIS — Z6831 Body mass index (BMI) 31.0-31.9, adult: Secondary | ICD-10-CM | POA: Diagnosis not present

## 2020-10-09 DIAGNOSIS — E782 Mixed hyperlipidemia: Secondary | ICD-10-CM | POA: Diagnosis not present

## 2020-10-09 DIAGNOSIS — E6609 Other obesity due to excess calories: Secondary | ICD-10-CM | POA: Diagnosis not present

## 2020-10-09 DIAGNOSIS — R7303 Prediabetes: Secondary | ICD-10-CM | POA: Diagnosis not present

## 2020-10-21 DIAGNOSIS — C61 Malignant neoplasm of prostate: Secondary | ICD-10-CM | POA: Diagnosis not present

## 2020-10-21 DIAGNOSIS — R351 Nocturia: Secondary | ICD-10-CM | POA: Diagnosis not present

## 2020-10-30 DIAGNOSIS — D649 Anemia, unspecified: Secondary | ICD-10-CM | POA: Diagnosis not present

## 2020-12-02 DIAGNOSIS — I10 Essential (primary) hypertension: Secondary | ICD-10-CM | POA: Diagnosis not present

## 2020-12-02 DIAGNOSIS — D649 Anemia, unspecified: Secondary | ICD-10-CM | POA: Diagnosis not present

## 2021-03-25 DIAGNOSIS — D225 Melanocytic nevi of trunk: Secondary | ICD-10-CM | POA: Diagnosis not present

## 2021-03-25 DIAGNOSIS — L814 Other melanin hyperpigmentation: Secondary | ICD-10-CM | POA: Diagnosis not present

## 2021-03-25 DIAGNOSIS — D1801 Hemangioma of skin and subcutaneous tissue: Secondary | ICD-10-CM | POA: Diagnosis not present

## 2021-03-25 DIAGNOSIS — D2239 Melanocytic nevi of other parts of face: Secondary | ICD-10-CM | POA: Diagnosis not present

## 2021-04-22 DIAGNOSIS — Z6831 Body mass index (BMI) 31.0-31.9, adult: Secondary | ICD-10-CM | POA: Diagnosis not present

## 2021-04-22 DIAGNOSIS — M1712 Unilateral primary osteoarthritis, left knee: Secondary | ICD-10-CM | POA: Diagnosis not present

## 2021-04-22 DIAGNOSIS — E6609 Other obesity due to excess calories: Secondary | ICD-10-CM | POA: Diagnosis not present

## 2021-04-22 DIAGNOSIS — R7303 Prediabetes: Secondary | ICD-10-CM | POA: Diagnosis not present

## 2021-04-22 DIAGNOSIS — I10 Essential (primary) hypertension: Secondary | ICD-10-CM | POA: Diagnosis not present

## 2021-04-22 DIAGNOSIS — D649 Anemia, unspecified: Secondary | ICD-10-CM | POA: Diagnosis not present

## 2021-04-22 DIAGNOSIS — C61 Malignant neoplasm of prostate: Secondary | ICD-10-CM | POA: Diagnosis not present

## 2021-04-23 DIAGNOSIS — H401121 Primary open-angle glaucoma, left eye, mild stage: Secondary | ICD-10-CM | POA: Diagnosis not present

## 2021-04-30 DIAGNOSIS — R351 Nocturia: Secondary | ICD-10-CM | POA: Diagnosis not present

## 2021-04-30 DIAGNOSIS — C61 Malignant neoplasm of prostate: Secondary | ICD-10-CM | POA: Diagnosis not present

## 2021-05-22 DIAGNOSIS — H353122 Nonexudative age-related macular degeneration, left eye, intermediate dry stage: Secondary | ICD-10-CM | POA: Diagnosis not present

## 2021-05-22 DIAGNOSIS — H353211 Exudative age-related macular degeneration, right eye, with active choroidal neovascularization: Secondary | ICD-10-CM | POA: Diagnosis not present

## 2021-05-27 DIAGNOSIS — Z96652 Presence of left artificial knee joint: Secondary | ICD-10-CM | POA: Diagnosis not present

## 2021-06-19 DIAGNOSIS — H353211 Exudative age-related macular degeneration, right eye, with active choroidal neovascularization: Secondary | ICD-10-CM | POA: Diagnosis not present

## 2021-08-21 DIAGNOSIS — I2510 Atherosclerotic heart disease of native coronary artery without angina pectoris: Secondary | ICD-10-CM | POA: Insufficient documentation

## 2023-03-23 ENCOUNTER — Telehealth: Payer: Self-pay | Admitting: Cardiology

## 2023-03-23 NOTE — Telephone Encounter (Signed)
   Pre-operative Risk Assessment    Patient Name: Austin Ayala  DOB: [birth year] MRN: [id number]      Request for Surgical Clearance    Procedure:   Lumbar Laminectomy  Date of Surgery:  Clearance TBD                                 Surgeon:  Dr. Monia Pouch  Surgeon's Group or Practice Name:  [facility] Phone number:  [phone] Fax number:  [phone]   Type of Clearance Requested:   - Medical    Type of Anesthesia:  General    Additional requests/questions:    SignedBelva Bertin   03/23/2023, 8:52 AM

## 2023-03-23 NOTE — Telephone Encounter (Signed)
Pt has appt with Dr. Jenene Slicker 03/24/23 as new pt appt. I will update all parties involved.

## 2023-03-23 NOTE — Telephone Encounter (Signed)
   Name: Austin Ayala  DOB: 09-29-1934  MRN: [id number]  Primary Cardiologist: Norman Herrlich, MD  Chart reviewed as part of pre-operative protocol coverage. Because of Kerven Steinhardt Wilborn's past medical history and time since last visit, he will require a follow-up in-office visit in order to better assess preoperative cardiovascular risk. Has not been seen since 2021  Pre-op covering staff: - Please schedule appointment and call patient to inform them. If patient already had an upcoming appointment within acceptable timeframe, please add "pre-op clearance" to the appointment notes so provider is aware. - Please contact requesting surgeon's office via preferred method (i.e, phone, fax) to inform them of need for appointment prior to surgery.  Joni Reining, NP  03/23/2023, 11:51 AM

## 2023-03-24 ENCOUNTER — Ambulatory Visit: Payer: PPO

## 2023-03-24 ENCOUNTER — Telehealth (HOSPITAL_COMMUNITY): Payer: Self-pay | Admitting: *Deleted

## 2023-03-24 VITALS — BP 148/60 | HR 65 | Ht 71.0 in | Wt 232.6 lb

## 2023-03-24 DIAGNOSIS — R600 Localized edema: Secondary | ICD-10-CM

## 2023-03-24 DIAGNOSIS — R011 Cardiac murmur, unspecified: Secondary | ICD-10-CM

## 2023-03-24 DIAGNOSIS — I10 Essential (primary) hypertension: Secondary | ICD-10-CM | POA: Diagnosis not present

## 2023-03-24 DIAGNOSIS — Z01810 Encounter for preprocedural cardiovascular examination: Secondary | ICD-10-CM | POA: Diagnosis not present

## 2023-03-24 NOTE — Patient Instructions (Signed)
Medication Instructions:  Your physician recommends that you continue on your current medications as directed. Please refer to the Current Medication list given to you today.  *If you need a refill on your cardiac medications before your next appointment, please call your pharmacy*   Lab Work: None If you have labs (blood work) drawn today and your tests are completely normal, you will receive your results only by: MyChart Message (if you have MyChart) OR A paper copy in the mail If you have any lab test that is abnormal or we need to change your treatment, we will call you to review the results.   Testing/Procedures: Your physician has requested that you have an echocardiogram. Echocardiography is a painless test that uses sound waves to create images of your heart. It provides your doctor with information about the size and shape of your heart and how well your heart's chambers and valves are working. This procedure takes approximately one hour. There are no restrictions for this procedure. Please do NOT wear cologne, perfume, aftershave, or lotions (deodorant is allowed). Please arrive 15 minutes prior to your appointment time.     [facility] [address] [place], [place] [address] Phone:  [phone]    Please arrive 15 minutes prior to your appointment time for registration and insurance purposes.  The test will take approximately 3 to 4 hours to complete; you may bring reading material.  If someone comes with you to your appointment, they will need to remain in the main lobby due to limited space in the testing area. **If you are pregnant or breastfeeding, please notify the nuclear lab prior to your appointment**  How to prepare for your Myocardial Perfusion Test: Do not eat or drink 3 hours prior to your test, except you may have water. Do not consume products containing caffeine (regular or decaffeinated) 12 hours prior to your test. (ex:  coffee, chocolate, sodas, tea). Do bring a list of your current medications with you.  If not listed below, you may take your medications as normal. Do wear comfortable clothes (no dresses or overalls) and walking shoes, tennis shoes preferred (No heels or open toe shoes are allowed). Do NOT wear cologne, perfume, aftershave, or lotions (deodorant is allowed). If these instructions are not followed, your test will have to be rescheduled.  Please report to [address] for your test.  If you have questions or concerns about your appointment, you can call the [facility] at [phone].  If you cannot keep your appointment, please provide 24 hours notification to the Nuclear Lab, to avoid a possible $50 charge to your account.    Follow-Up: At [facility], you and your health needs are our priority.  As part of our continuing mission to provide you with exceptional heart care, we have created designated Provider Care Teams.  These Care Teams include your primary Cardiologist (physician) and Advanced Practice Providers (APPs -  Physician Assistants and Nurse Practitioners) who all work together to provide you with the care you need, when you need it.  We recommend signing up for the patient portal called "MyChart".  Sign up information is provided on this After Visit Summary.  MyChart is used to connect with patients for Virtual Visits (Telemedicine).  Patients are able to view lab/test results, encounter notes, upcoming appointments, etc.  Non-urgent messages can be sent to your provider as well.   To learn more about what you can do  with MyChart, go to [email or web address].    Your next appointment:   Follow up to be determined based on testing results  Provider:   Huntley Dec, MD    Other Instructions None

## 2023-03-24 NOTE — Assessment & Plan Note (Signed)
Benign flow murmur versus stenotic murmur likely from aortic valve. Will obtain a transthoracic echocardiogram.

## 2023-03-24 NOTE — Assessment & Plan Note (Signed)
Appears to be  suboptimal at the office today. At home appears to be better controlled. Continue current medications amlodipine 5 mg once daily and olmesartan 40 mg once daily. Will review echocardiogram results once available for any hypertensive heart disease. Austin Ayala

## 2023-03-24 NOTE — Progress Notes (Signed)
Cardiology Consultation:    Date:  03/24/2023   ID:  Austin Ayala, DOB 1935/02/23, MRN [id number]  PCP:  Gordan Payment., MD  Cardiologist:  Luretha Murphy, MD   Referring MD: Gordan Payment., MD   Perioperative cardiovascular risk assessment.   ASSESSMENT AND PLAN:   Austin Ayala 88 year old male patient with history of hypertension, now anticipating surgery for his back pain, here for preoperative cardiovascular risk assessment.  Problem List Items Addressed This Visit     Preoperative cardiovascular examination - Primary    Elective back surgery. Functional status limited due to back pain radiating to legs. Unable to assess his functional status if able to meet 4 METS. No prior significant cardiac history. He does have high blood pressure longstanding and mild bilateral lower extremity edema 3/6 ejection systolic murmur.  In this context we will further evaluate for cardiac risk with a Lexiscan stress test with nuclear imaging. If no high risk findings should be okay to proceed with surgery as being planned. If any high risk findings are observed will review results and further risk assessment accordingly.       Relevant Orders   EKG 12-Lead (Completed)   ECHOCARDIOGRAM COMPLETE   MYOCARDIAL PERFUSION IMAGING   Heart murmur, systolic    Benign flow murmur versus stenotic murmur likely from aortic valve. Will obtain a transthoracic echocardiogram.      Relevant Orders   ECHOCARDIOGRAM COMPLETE   Pedal edema    Bilateral, chronic, dependent, improves overnight. Will assess diastolic function with echocardiogram. Differential would be diastolic dysfunction versus amlodipine related effect.       Relevant Orders   ECHOCARDIOGRAM COMPLETE   Hypertension    Appears to be  suboptimal at the office today. At home appears to be better controlled. Continue current medications amlodipine 5 mg once daily and olmesartan 40 mg once daily. Will review echocardiogram  results once available for any hypertensive heart disease. .       Relevant Medications   amLODipine (NORVASC) 5 MG tablet   olmesartan (BENICAR) 40 MG tablet   Return to clinic based on test results.    History of Present Illness:    Austin Ayala is a 88 y.o. male who is being seen today for the evaluation of preoperative cardiovascular risk assessment prior to elective back surgery under general anesthesia, to be done by Dr. Jake Samples at the request of Gordan Payment., MD.  He previously had a visit with Dr. Dulce Sellar in August 2021 for preoperative cardiovascular exam.  He has history of elevated blood pressure readings and was started on Cozaar at that visit by Dr. Dulce Sellar.  He had nonspecific EKG findings consistent with LVH pattern. Also has history of osteoarthritis of the knee underwent left knee arthroplasty, now with chronic back pain and anticipating surgery.  Very pleasant gentleman with no prior history of cardiac disease. Here for the visit accompanied by his wife. Mentions over the past year and a half his functional status has been limited due to back pain which he describes as an ache that radiates down the legs.  Is anticipating surgery under general anesthesia with a laminectomy.  Denies any significant chest pain or shortness of breath, orthopnea, paroxysmal nocturnal dyspnea.  Denies any palpitations, lightheadedness, dizziness or syncopal episodes. Has bilateral mild pedal edema.  For blood pressure initially he was started on Cozaar by Dr. Dulce Sellar, subsequently PCP had switched her medications and he has been on amlodipine 5 mg once  daily and olmesartan 40 mg once daily and appears to be well-controlled at home.  At times blood pressure can bump up to systolic 140s.  EKG in the clinic today shows sinus rhythm with first-degree AV block PR interval 240 ms. Heart rate 65/min.  Reports remote echocardiogram over 20 years ago and had treadmill stress test which were  unremarkable.  I do not have copies of these results. Past Medical History:  Diagnosis Date   Benign neoplasm of colon 07/24/2015   Formatting of this note might be different from the original. Colon 2014. Stopping.   Closed wedge compression fracture of L3 vertebra with routine healing 05/24/2019   Formatting of this note might be different from the original. Per pt message 05/24/19   Elevated prostate specific antigen (PSA)    Gout, arthropathy    H/O carpal tunnel syndrome    H/O herpes zoster    H/O: hypertension    Heart murmur    Hypertension    Malignant neoplasm prostate (HCC) 07/24/2015   Formatting of this note might be different from the original. Dr. Saddie Benders managing.  Twice a year.   Mixed hyperlipidemia 09/28/2019   Onychomycosis 07/24/2015   Osteoarthritis 07/24/2015   Sleep apnea 07/24/2015   Formatting of this note might be different from the original. Infrequent CPAP   Ventral hernia without obstruction or gangrene 08/21/2016   Formatting of this note might be different from the original. Diastasis recti.    Past Surgical History:  Procedure Laterality Date   RADIOACTIVE SEED IMPLANT  2010   REPLACEMENT TOTAL KNEE BILATERAL     TONSILLECTOMY     TOTAL KNEE REVISION Left 05/13/2020   Procedure: TOTAL KNEE REVISION;  Surgeon: Dannielle Huh, MD;  Location: [facility];  Service: Orthopedics;  Laterality: Left;    Current Medications: Current Meds  Medication Sig   acetaminophen (TYLENOL) 500 MG tablet Take 500 mg by mouth 4 (four) times daily.   Alpha-Lipoic Acid 600 MG CAPS Take 600 mg by mouth daily.   amLODipine (NORVASC) 5 MG tablet Take 5 mg by mouth daily.   ascorbic acid (VITAMIN C) 500 MG tablet Take 500 mg by mouth daily.    BLACK PEPPER-TURMERIC PO Take 1 tablet by mouth daily. 5mg  black pepper, 500 mg of turmeric   cholecalciferol (VITAMIN D3) 25 MCG (1000 UNIT) tablet Take 1,000 Units by mouth daily.   Coenzyme Q10 (COQ-10) 100 MG CAPS Take 100 mg by mouth daily.  With 180 mg of K2   Copper Gluconate (COPPER CAPS) 2 MG CAPS Take 2 mg by mouth at bedtime.    DM-Phenylephrine-Acetaminophen (COLD/FLU DAYTIME RELIEF PO) Take 1 capsule by mouth daily as needed (congsetion).   Ginkgo Biloba 120 MG CAPS Take 120 mg by mouth daily.   GRAPE SEED EXTRACT PO Take 380 mg by mouth daily.   HAWTHORN BERRIES PO Take 100 mg by mouth daily.   latanoprost (XALATAN) 0.005 % ophthalmic solution Place 1 drop into both eyes at bedtime.   LYCOPENE PO Take 40 mg by mouth daily.   Multiple Vitamin (MULTIVITAMIN) capsule Take 1 capsule by mouth daily.   olmesartan (BENICAR) 40 MG tablet Take 40 mg by mouth daily.   OVER THE COUNTER MEDICATION Take 1 tablet by mouth daily. Nitro-oxide otc supplement   OVER THE COUNTER MEDICATION Take 1 tablet by mouth at bedtime. CBD with melatonin   vitamin B-12 (CYANOCOBALAMIN) 500 MCG tablet Take 500 mcg by mouth daily.   vitamin E 180 MG (  400 UNITS) capsule Take 400 Units by mouth daily.      Allergies:   Doxycycline hyclate   Social History   Socioeconomic History   Marital status: Married    Spouse name: Not on file   Number of children: Not on file   Years of education: Not on file   Highest education level: Not on file  Occupational History   Not on file  Tobacco Use   Smoking status: Never   Smokeless tobacco: Never  Vaping Use   Vaping status: Never Used  Substance and Sexual Activity   Alcohol use: Never   Drug use: Never   Sexual activity: Not on file  Other Topics Concern   Not on file  Social History Narrative   Not on file   Social Determinants of Health   Financial Resource Strain: Not on file  Food Insecurity: Not on file  Transportation Needs: Not on file  Physical Activity: Not on file  Stress: Not on file  Social Connections: Not on file     Family History: The patient's family history includes Alzheimer's disease in his brother; Heart disease in his brother and brother; Heart failure in his  mother; Hypertension in his mother; Melanoma in his brother and brother. ROS:   Please see the history of present illness.    All 14 point review of systems negative except as described per history of present illness.  EKGs/Labs/Other Studies Reviewed:    The following studies were reviewed today:   EKG:  EKG Interpretation Date/Time:  Wednesday March 24 2023 09:40:05 EDT Ventricular Rate:  65 PR Interval:  214 QRS Duration:  102 QT Interval:  386 QTC Calculation: 401 R Axis:   0  Text Interpretation: Sinus rhythm with 1st degree A-V block Septal infarct , age undetermined When compared with ECG of 09-Sep-2009 11:52, PR interval has increased Septal infarct is now Present Confirmed by Huntley Dec reddy [phone]) on 03/24/2023 10:08:51 AM    Recent Labs: No results found for requested labs within last 365 days.  Recent Lipid Panel No results found for: "CHOL", "TRIG", "HDL", "CHOLHDL", "VLDL", "LDLCALC", "LDLDIRECT"  Physical Exam:    VS:  BP (!) 148/60 (BP Location: Left Arm, Patient Position: Sitting, Cuff Size: Normal)   Pulse 65   Ht 5\' 11"  (1.803 m)   Wt 232 lb 9.6 oz (105.5 kg)   SpO2 95%   BMI 32.44 kg/m     Wt Readings from Last 3 Encounters:  03/24/23 232 lb 9.6 oz (105.5 kg)  05/13/20 225 lb 1 oz (102.1 kg)  05/07/20 225 lb 1 oz (102.1 kg)     GENERAL:  Well nourished, well developed in no acute distress NECK: No JVD; No carotid bruits CARDIAC: RRR, S1 and S2 present, 3/6 ejection systolic murmur best heard in aortic area CHEST:  Clear to auscultation without rales, wheezing or rhonchi  ABDOMEN: Central obesity, distended, soft. Extremities: 1+ bilateral pitting pedal edema extending up above the ankles. Pulses bilaterally symmetric with radial 2+ and dorsalis pedis 2+ NEUROLOGIC:  Alert and oriented x 3  Medication Adjustments/Labs and Tests Ordered: Current medicines are reviewed at length with the patient today.  Concerns regarding medicines are  outlined above.  Orders Placed This Encounter  Procedures   MYOCARDIAL PERFUSION IMAGING   EKG 12-Lead   ECHOCARDIOGRAM COMPLETE   No orders of the defined types were placed in this encounter.   Signed, Cecille Amsterdam, MD, MPH, [facility]. 03/24/2023 10:31 AM  [facility]

## 2023-03-24 NOTE — Assessment & Plan Note (Signed)
Bilateral, chronic, dependent, improves overnight. Will assess diastolic function with echocardiogram. Differential would be diastolic dysfunction versus amlodipine related effect.

## 2023-03-24 NOTE — Telephone Encounter (Signed)
Pt reached and given instructions for MPI study. 

## 2023-03-24 NOTE — Assessment & Plan Note (Signed)
Elective back surgery. Functional status limited due to back pain radiating to legs. Unable to assess his functional status if able to meet 4 METS. No prior significant cardiac history. He does have high blood pressure longstanding and mild bilateral lower extremity edema 3/6 ejection systolic murmur.  In this context we will further evaluate for cardiac risk with a Lexiscan stress test with nuclear imaging. If no high risk findings should be okay to proceed with surgery as being planned. If any high risk findings are observed will review results and further risk assessment accordingly.

## 2023-03-31 ENCOUNTER — Ambulatory Visit: Payer: PPO

## 2023-03-31 DIAGNOSIS — Z01810 Encounter for preprocedural cardiovascular examination: Secondary | ICD-10-CM | POA: Diagnosis not present

## 2023-03-31 MED ORDER — TECHNETIUM TC 99M TETROFOSMIN IV KIT
29.3000 | PACK | Freq: Once | INTRAVENOUS | Status: AC | PRN
  Administered 2023-03-31: 29.3 via INTRAVENOUS

## 2023-03-31 MED ORDER — REGADENOSON 0.4 MG/5ML IV SOLN
0.4000 mg | Freq: Once | INTRAVENOUS | Status: AC
Start: 2023-03-31 — End: 2023-03-31
  Administered 2023-03-31: 0.4 mg via INTRAVENOUS

## 2023-03-31 MED ORDER — TECHNETIUM TC 99M TETROFOSMIN IV KIT
11.0000 | PACK | Freq: Once | INTRAVENOUS | Status: AC | PRN
  Administered 2023-03-31: 11 via INTRAVENOUS

## 2023-04-01 LAB — MYOCARDIAL PERFUSION IMAGING
LV dias vol: 128 mL (ref 62–150)
LV sys vol: 54 mL
Nuc Stress EF: 58 %
Peak HR: 71 {beats}/min
Rest HR: 47 {beats}/min
Rest Nuclear Isotope Dose: 11 mCi
SDS: 4
SRS: 6
SSS: 10
ST Depression (mm): 0 mm
Stress Nuclear Isotope Dose: 29.3 mCi
TID: 1.05

## 2023-04-05 NOTE — Progress Notes (Signed)
Hi Austin Ayala,  Please inform Austin Ayala that his stress test results have come back normal. There are no concerns for reduced blood flow. This was a low risk study.  From cardiac standpoint he should be okay to proceed with his back surgery as being planned. We are still waiting on the echocardiogram to be completed to assess his heart murmur, please see if this can be expedited.  Thank you

## 2023-04-23 ENCOUNTER — Ambulatory Visit: Payer: PPO

## 2023-04-23 DIAGNOSIS — R600 Localized edema: Secondary | ICD-10-CM | POA: Diagnosis not present

## 2023-04-23 DIAGNOSIS — Z01810 Encounter for preprocedural cardiovascular examination: Secondary | ICD-10-CM

## 2023-04-23 DIAGNOSIS — R011 Cardiac murmur, unspecified: Secondary | ICD-10-CM | POA: Diagnosis not present

## 2023-04-24 LAB — ECHOCARDIOGRAM COMPLETE
AR max vel: 1.68 cm2
AV Area VTI: 1.72 cm2
AV Area mean vel: 1.66 cm2
AV Mean grad: 7 mm[Hg]
AV Peak grad: 13.3 mm[Hg]
Ao pk vel: 1.83 m/s
Area-P 1/2: 2.81 cm2
S' Lateral: 3.5 cm

## 2023-04-26 NOTE — Progress Notes (Signed)
Please inform Austin Ayala that his ultrasound results of the heart show normal pumping function, ejection fraction 60 to 65%.  There is mildly increased stiffness of the heart muscle which could be the reason for his mild edema of the lower extremities.  No other significant valve abnormalities. I would recommend he cut back on his salt intake in the diet overall.  [Less than 2 g/day recommended]  He should notify us or his primary care provider if he notices increased swelling in the legs, which would most likely prompt starting him on a water pill.  Follow-up visit with [place] in 1 year, or as needed.

## 2023-04-27 ENCOUNTER — Telehealth: Payer: Self-pay

## 2023-04-27 NOTE — Telephone Encounter (Signed)
Spoke with pt regarding ECHO and Lexiscan results per Dr. Madireddy's note. Encouraged pt to have surgeon send a surgical clearance form to our office to complete. Pt verbalized understanding and had no further questions.

## 2023-04-27 NOTE — Telephone Encounter (Signed)
Returned pts call regarding results. LM for pt to call back.

## 2023-04-27 NOTE — Telephone Encounter (Signed)
Patient is returning call in regards to results. Requesting call back. 

## 2023-04-27 NOTE — Telephone Encounter (Signed)
Returning call for results. Please advise

## 2023-04-30 ENCOUNTER — Telehealth: Payer: Self-pay

## 2023-04-30 NOTE — Telephone Encounter (Signed)
Pre-operative Risk Assessment    Patient Name: Austin Ayala  DOB: May 28, 1935 MRN: [id number]      Request for Surgical Clearance    Procedure:   lumbar laminectomy  Date of Surgery:  Clearance TBD                                 Surgeon:  Dr. Kendell Bane Dawley Surgeon's Group or Practice Name:  [facility] Phone number:  [phone] Fax number:  [phone]   Type of Clearance Requested:   - Medical    Type of Anesthesia:  General    Additional requests/questions:    SignedDione Housekeeper   04/30/2023, 1:51 PM

## 2023-04-30 NOTE — Telephone Encounter (Signed)
Name: Austin Ayala  DOB: 1935/06/07  MRN: [id number]   Primary Cardiologist: Norman Herrlich, MD  Chart reviewed as part of pre-operative protocol coverage. JAMARL ALCAUTER was last seen on 03/24/2023 by Dr. Vincent Gros.  Patient's functional status was not able to be determined at that time.  He underwent nuclear stress test which was a normal, low risk study without ischemia or infarction.  Echo showed normal LV/RV function with Grade II DD.  Per Dr. Vincent Gros "From cardiac standpoint he should be okay to proceed with his back surgery as being planned."  I will route this recommendation to the requesting party via Epic fax function and remove from pre-op pool. Please call with questions.  Carlos Levering, NP 04/30/2023, 2:01 PM

## 2023-05-03 ENCOUNTER — Other Ambulatory Visit: Payer: Self-pay | Admitting: Neurological Surgery

## 2023-05-03 ENCOUNTER — Telehealth: Payer: Self-pay | Admitting: Cardiology

## 2023-05-03 NOTE — Telephone Encounter (Signed)
Pt's PCP calling to request that last office visit notes as well documentation of whether pt is cleared for upcoming procedure. Please advise

## 2023-05-03 NOTE — Telephone Encounter (Signed)
Faxed patient's surgical clearance, office note from 03/24/23, echo and stress test results to PCP.

## 2023-05-12 ENCOUNTER — Other Ambulatory Visit: Payer: Self-pay | Admitting: Neurological Surgery

## 2023-05-21 NOTE — Pre-Procedure Instructions (Signed)
Surgical Instructions   Your procedure is scheduled on June 01, 2023. Report to [facility] Main Entrance "A" at 7:00 A.M., then check in with the Admitting office. Any questions or running late day of surgery: call [phone]  Questions prior to your surgery date: call [phone], Monday-Friday, 8am-4pm. If you experience any cold or flu symptoms such as cough, fever, chills, shortness of breath, etc. between now and your scheduled surgery, please notify us at the above number.     Remember:  Do not eat or drink after midnight the night before your surgery   Take these medicines the morning of surgery with A SIP OF WATER : Acetaminophen (Tylenol) Amlodipine (Norvasc) Gabapentin (Neurontin)  One week prior to surgery, STOP taking any Aspirin (unless otherwise instructed by your surgeon) Aleve, Naproxen, Ibuprofen, Motrin, Advil, Goody's, BC's, all herbal medications, fish oil, and non-prescription vitamins.                     Do NOT Smoke (Tobacco/Vaping) for 24 hours prior to your procedure.  If you use a CPAP at night, you may bring your mask/headgear for your overnight stay.   You will be asked to remove any contacts, glasses, piercing's, hearing aid's, dentures/partials prior to surgery. Please bring cases for these items if needed.    Patients discharged the day of surgery will not be allowed to drive home, and someone needs to stay with them for 24 hours.  SURGICAL WAITING ROOM VISITATION Patients may have no more than 2 support people in the waiting area - these visitors may rotate.   Pre-op nurse will coordinate an appropriate time for 1 ADULT support person, who may not rotate, to accompany patient in pre-op.  Children under the age of 39 must have an adult with them who is not the patient and must remain in the main waiting area with an adult.  If the patient needs to stay at the hospital during part of their recovery, the visitor guidelines for inpatient rooms  apply.  Please refer to the [facility] website for the visitor guidelines for any additional information.   If you received a COVID test during your pre-op visit  it is requested that you wear a mask when out in public, stay away from anyone that may not be feeling well and notify your surgeon if you develop symptoms. If you have been in contact with anyone that has tested positive in the last 10 days please notify you surgeon.      Pre-operative 5 CHG Bathing Instructions   You can play a key role in reducing the risk of infection after surgery. Your skin needs to be as free of germs as possible. You can reduce the number of germs on your skin by washing with CHG (chlorhexidine gluconate) soap before surgery. CHG is an antiseptic soap that kills germs and continues to kill germs even after washing.   DO NOT use if you have an allergy to chlorhexidine/CHG or antibacterial soaps. If your skin becomes reddened or irritated, stop using the CHG and notify one of our RNs at [phone].   Please shower with the CHG soap starting 4 days before surgery using the following schedule:     Please keep in mind the following:  DO NOT shave, including legs and underarms, starting the day of your first shower.   You may shave your face at any point before/day of surgery.  Place clean sheets on your bed the day you start  using CHG soap. Use a clean washcloth (not used since being washed) for each shower. DO NOT sleep with pets once you start using the CHG.   CHG Shower Instructions:  Wash your face and private area with normal soap. If you choose to wash your hair, wash first with your normal shampoo.  After you use shampoo/soap, rinse your hair and body thoroughly to remove shampoo/soap residue.  Turn the water OFF and apply about 3 tablespoons (45 ml) of CHG soap to a CLEAN washcloth.  Apply CHG soap ONLY FROM YOUR NECK DOWN TO YOUR TOES (washing for 3-5 minutes)  DO NOT use CHG soap on face,  private areas, open wounds, or sores.  Pay special attention to the area where your surgery is being performed.  If you are having back surgery, having someone wash your back for you may be helpful. Wait 2 minutes after CHG soap is applied, then you may rinse off the CHG soap.  Pat dry with a clean towel  Put on clean clothes/pajamas   If you choose to wear lotion, please use ONLY the CHG-compatible lotions on the back of this paper.   Additional instructions for the day of surgery: DO NOT APPLY any lotions, deodorants, cologne, or perfumes.   Do not bring valuables to the hospital. [facility] is not responsible for any belongings/valuables. Do not wear nail polish, gel polish, artificial nails, or any other type of covering on natural nails (fingers and toes) Do not wear jewelry or makeup Put on clean/comfortable clothes.  Please brush your teeth.  Ask your nurse before applying any prescription medications to the skin.     CHG Compatible Lotions   Aveeno Moisturizing lotion  Cetaphil Moisturizing Cream  Cetaphil Moisturizing Lotion  Clairol Herbal Essence Moisturizing Lotion, Dry Skin  Clairol Herbal Essence Moisturizing Lotion, Extra Dry Skin  Clairol Herbal Essence Moisturizing Lotion, Normal Skin  Curel Age Defying Therapeutic Moisturizing Lotion with Alpha Hydroxy  Curel Extreme Care Body Lotion  Curel Soothing Hands Moisturizing Hand Lotion  Curel Therapeutic Moisturizing Cream, Fragrance-Free  Curel Therapeutic Moisturizing Lotion, Fragrance-Free  Curel Therapeutic Moisturizing Lotion, Original Formula  Eucerin Daily Replenishing Lotion  Eucerin Dry Skin Therapy Plus Alpha Hydroxy Crme  Eucerin Dry Skin Therapy Plus Alpha Hydroxy Lotion  Eucerin Original Crme  Eucerin Original Lotion  Eucerin Plus Crme Eucerin Plus Lotion  Eucerin TriLipid Replenishing Lotion  Keri Anti-Bacterial Hand Lotion  Keri Deep Conditioning Original Lotion Dry Skin Formula Softly  Scented  Keri Deep Conditioning Original Lotion, Fragrance Free Sensitive Skin Formula  Keri Lotion Fast Absorbing Fragrance Free Sensitive Skin Formula  Keri Lotion Fast Absorbing Softly Scented Dry Skin Formula  Keri Original Lotion  Keri Skin Renewal Lotion Keri Silky Smooth Lotion  Keri Silky Smooth Sensitive Skin Lotion  Nivea Body Creamy Conditioning Oil  Nivea Body Extra Enriched Lotion  Nivea Body Original Lotion  Nivea Body Sheer Moisturizing Lotion Nivea Crme  Nivea Skin Firming Lotion  NutraDerm 30 Skin Lotion  NutraDerm Skin Lotion  NutraDerm Therapeutic Skin Cream  NutraDerm Therapeutic Skin Lotion  ProShield Protective Hand Cream  Provon moisturizing lotion  Please read over the following fact sheets that you were given.

## 2023-05-24 ENCOUNTER — Encounter (HOSPITAL_COMMUNITY)
Admission: RE | Admit: 2023-05-24 | Discharge: 2023-05-24 | Disposition: A | Discharge status: Home / Self Care | Payer: PPO | Source: Ambulatory Visit | Attending: Neurological Surgery | Admitting: Neurological Surgery

## 2023-05-24 ENCOUNTER — Other Ambulatory Visit: Payer: Self-pay

## 2023-05-24 ENCOUNTER — Encounter (HOSPITAL_COMMUNITY): Payer: Self-pay

## 2023-05-24 VITALS — BP 139/50 | HR 63 | Temp 97.6°F | Resp 17 | Ht 71.0 in | Wt 224.0 lb

## 2023-05-24 DIAGNOSIS — Z01812 Encounter for preprocedural laboratory examination: Secondary | ICD-10-CM | POA: Insufficient documentation

## 2023-05-24 DIAGNOSIS — Z01818 Encounter for other preprocedural examination: Secondary | ICD-10-CM

## 2023-05-24 DIAGNOSIS — I10 Essential (primary) hypertension: Secondary | ICD-10-CM | POA: Diagnosis not present

## 2023-05-24 HISTORY — DX: Unspecified macular degeneration: H35.30

## 2023-05-24 LAB — CBC
HCT: 38.8 % — ABNORMAL LOW (ref 39.0–52.0)
Hemoglobin: 12.8 g/dL — ABNORMAL LOW (ref 13.0–17.0)
MCH: 33.2 pg (ref 26.0–34.0)
MCHC: 33 g/dL (ref 30.0–36.0)
MCV: 100.8 fL — ABNORMAL HIGH (ref 80.0–100.0)
Platelets: 301 10*3/uL (ref 150–400)
RBC: 3.85 MIL/uL — ABNORMAL LOW (ref 4.22–5.81)
RDW: 12.1 % (ref 11.5–15.5)
WBC: 5.6 10*3/uL (ref 4.0–10.5)
nRBC: 0 % (ref 0.0–0.2)

## 2023-05-24 LAB — BASIC METABOLIC PANEL
Anion gap: 9 (ref 5–15)
BUN: 12 mg/dL (ref 8–23)
CO2: 25 mmol/L (ref 22–32)
Calcium: 9.1 mg/dL (ref 8.9–10.3)
Chloride: 100 mmol/L (ref 98–111)
Creatinine, Ser: 0.7 mg/dL (ref 0.61–1.24)
GFR, Estimated: >60 mL/min (ref >60)
Glucose, Bld: 100 mg/dL — ABNORMAL HIGH (ref 70–99)
Potassium: 4.1 mmol/L (ref 3.5–5.1)
Sodium: 134 mmol/L — ABNORMAL LOW (ref 135–145)

## 2023-05-24 LAB — SURGICAL PCR SCREEN
MRSA, PCR: NEGATIVE
Staphylococcus aureus: NEGATIVE

## 2023-05-24 NOTE — Progress Notes (Signed)
PCP - Dr. Feliciana Rossetti Cardiologist - Dr. Norman Herrlich  PPM/ICD - denies   Chest x-ray - 09/09/09 EKG - 03/24/23 Stress Test - 03/31/23 ECHO - 04/23/23 Cardiac Cath - denies  Sleep Study - OSA+ CPAP - denies  DM- denies  ASA/Blood Thinner Instructions: n/a   ERAS Protcol - no, NPO   COVID TEST- n/a   Anesthesia review: yes, cardiac hx  Patient denies shortness of breath, fever, cough and chest pain at PAT appointment   All instructions explained to the patient, with a verbal understanding of the material. Patient agrees to go over the instructions while at home for a better understanding.  The opportunity to ask questions was provided.

## 2023-06-01 ENCOUNTER — Other Ambulatory Visit: Payer: Self-pay

## 2023-06-01 ENCOUNTER — Ambulatory Visit (HOSPITAL_COMMUNITY): Payer: PPO

## 2023-06-01 ENCOUNTER — Encounter (HOSPITAL_COMMUNITY): Payer: Self-pay | Admitting: Neurological Surgery

## 2023-06-01 ENCOUNTER — Observation Stay (HOSPITAL_COMMUNITY)
Admission: RE | Admit: 2023-06-01 | Discharge: 2023-06-02 | Disposition: A | Discharge status: Home / Self Care | Payer: PPO | Attending: Neurological Surgery | Admitting: Neurological Surgery

## 2023-06-01 ENCOUNTER — Encounter (HOSPITAL_COMMUNITY)
Admission: RE | Disposition: A | Discharge status: Home / Self Care | Payer: Self-pay | Source: Home / Self Care | Attending: Neurological Surgery

## 2023-06-01 ENCOUNTER — Ambulatory Visit (HOSPITAL_BASED_OUTPATIENT_CLINIC_OR_DEPARTMENT_OTHER): Payer: PPO

## 2023-06-01 ENCOUNTER — Ambulatory Visit (HOSPITAL_COMMUNITY): Payer: PPO | Admitting: Physician Assistant

## 2023-06-01 DIAGNOSIS — M48062 Spinal stenosis, lumbar region with neurogenic claudication: Secondary | ICD-10-CM | POA: Diagnosis present

## 2023-06-01 DIAGNOSIS — I10 Essential (primary) hypertension: Secondary | ICD-10-CM | POA: Insufficient documentation

## 2023-06-01 DIAGNOSIS — Z79899 Other long term (current) drug therapy: Secondary | ICD-10-CM | POA: Diagnosis not present

## 2023-06-01 DIAGNOSIS — M48061 Spinal stenosis, lumbar region without neurogenic claudication: Principal | ICD-10-CM | POA: Diagnosis present

## 2023-06-01 DIAGNOSIS — Z96653 Presence of artificial knee joint, bilateral: Secondary | ICD-10-CM | POA: Insufficient documentation

## 2023-06-01 DIAGNOSIS — Z8546 Personal history of malignant neoplasm of prostate: Secondary | ICD-10-CM | POA: Diagnosis not present

## 2023-06-01 HISTORY — PX: LUMBAR LAMINECTOMY/DECOMPRESSION MICRODISCECTOMY: SHX5026

## 2023-06-01 SURGERY — LUMBAR LAMINECTOMY/DECOMPRESSION MICRODISCECTOMY 3 LEVELS
Anesthesia: General | Site: Spine Lumbar

## 2023-06-01 MED ORDER — MORPHINE SULFATE (PF) 2 MG/ML IV SOLN: 1.0000 mg | INTRAVENOUS | Status: DC | PRN

## 2023-06-01 MED ORDER — LIDOCAINE 2% (20 MG/ML) 5 ML SYRINGE
INTRAMUSCULAR | Status: DC | PRN
  Administered 2023-06-01: 100 mg via INTRAVENOUS

## 2023-06-01 MED ORDER — DOCUSATE SODIUM 100 MG PO CAPS
100.0000 mg | ORAL_CAPSULE | Freq: Two times a day (BID) | ORAL | Status: DC
Start: 2023-06-01 — End: 2023-06-02
  Administered 2023-06-01 – 2023-06-02 (×3): 100 mg via ORAL
  Filled 2023-06-01 (×3): qty 1

## 2023-06-01 MED ORDER — IRBESARTAN 300 MG PO TABS
300.0000 mg | ORAL_TABLET | Freq: Every day | ORAL | Status: DC
  Administered 2023-06-01 – 2023-06-02 (×2): 300 mg via ORAL
  Filled 2023-06-01: qty 1
  Filled 2023-06-01: qty 2
  Filled 2023-06-01: qty 1
  Filled 2023-06-01: qty 2

## 2023-06-01 MED ORDER — ORAL CARE MOUTH RINSE: 15.0000 mL | Freq: Once | OROMUCOSAL | Status: AC

## 2023-06-01 MED ORDER — CHLORHEXIDINE GLUCONATE CLOTH 2 % EX PADS: 6.0000 | MEDICATED_PAD | Freq: Once | CUTANEOUS | Status: DC

## 2023-06-01 MED ORDER — EPHEDRINE SULFATE-NACL 50-0.9 MG/10ML-% IV SOSY
PREFILLED_SYRINGE | INTRAVENOUS | Status: DC | PRN
  Administered 2023-06-01 (×3): 5 mg via INTRAVENOUS

## 2023-06-01 MED ORDER — FENTANYL CITRATE (PF) 250 MCG/5ML IJ SOLN
INTRAMUSCULAR | Status: AC
  Filled 2023-06-01: qty 5

## 2023-06-01 MED ORDER — LACTATED RINGERS IV SOLN: INTRAVENOUS | Status: DC

## 2023-06-01 MED ORDER — DEXAMETHASONE SODIUM PHOSPHATE 10 MG/ML IJ SOLN
INTRAMUSCULAR | Status: DC | PRN
  Administered 2023-06-01: 5 mg via INTRAVENOUS

## 2023-06-01 MED ORDER — SODIUM CHLORIDE 0.9 % IV SOLN: INTRAVENOUS | Status: DC

## 2023-06-01 MED ORDER — PHENOL 1.4 % MT LIQD: 1.0000 | OROMUCOSAL | Status: DC | PRN

## 2023-06-01 MED ORDER — EPHEDRINE 5 MG/ML INJ
INTRAVENOUS | Status: AC
  Filled 2023-06-01: qty 5

## 2023-06-01 MED ORDER — LATANOPROST 0.005 % OP SOLN
1.0000 [drp] | Freq: Every day | OPHTHALMIC | Status: DC
  Administered 2023-06-01: 1 [drp] via OPHTHALMIC
  Filled 2023-06-01: qty 2.5

## 2023-06-01 MED ORDER — PHENYLEPHRINE HCL-NACL 20-0.9 MG/250ML-% IV SOLN
INTRAVENOUS | Status: DC | PRN
  Administered 2023-06-01: 20 ug/min via INTRAVENOUS

## 2023-06-01 MED ORDER — LIDOCAINE-EPINEPHRINE 1 %-1:100000 IJ SOLN
INTRAMUSCULAR | Status: DC | PRN
  Administered 2023-06-01: 5 mL

## 2023-06-01 MED ORDER — LIDOCAINE-EPINEPHRINE 1 %-1:100000 IJ SOLN
INTRAMUSCULAR | Status: AC
  Filled 2023-06-01: qty 1

## 2023-06-01 MED ORDER — FENTANYL CITRATE (PF) 250 MCG/5ML IJ SOLN
INTRAMUSCULAR | Status: DC | PRN
  Administered 2023-06-01 (×2): 50 ug via INTRAVENOUS

## 2023-06-01 MED ORDER — DEXAMETHASONE SODIUM PHOSPHATE 10 MG/ML IJ SOLN
INTRAMUSCULAR | Status: AC
  Filled 2023-06-01: qty 1

## 2023-06-01 MED ORDER — BUPIVACAINE LIPOSOME 1.3 % IJ SUSP
INTRAMUSCULAR | Status: AC
  Filled 2023-06-01: qty 20

## 2023-06-01 MED ORDER — ACETAMINOPHEN 650 MG RE SUPP: 650.0000 mg | RECTAL | Status: DC | PRN

## 2023-06-01 MED ORDER — HYDROCODONE-ACETAMINOPHEN 5-325 MG PO TABS: 1.0000 | ORAL_TABLET | Freq: Four times a day (QID) | ORAL | Status: DC | PRN

## 2023-06-01 MED ORDER — CHLORHEXIDINE GLUCONATE 0.12 % MT SOLN
15.0000 mL | Freq: Once | OROMUCOSAL | Status: AC
  Administered 2023-06-01: 15 mL via OROMUCOSAL
  Filled 2023-06-01: qty 15

## 2023-06-01 MED ORDER — CEFAZOLIN SODIUM-DEXTROSE 2-4 GM/100ML-% IV SOLN
2.0000 g | Freq: Four times a day (QID) | INTRAVENOUS | Status: AC
  Administered 2023-06-01 (×2): 2 g via INTRAVENOUS
  Filled 2023-06-01 (×2): qty 100

## 2023-06-01 MED ORDER — POLYETHYLENE GLYCOL 3350 17 G PO PACK
17.0000 g | PACK | Freq: Every day | ORAL | Status: DC | PRN
Start: 2023-06-01 — End: 2023-06-02

## 2023-06-01 MED ORDER — OXYCODONE HCL 5 MG/5ML PO SOLN: 5.0000 mg | Freq: Once | ORAL | Status: DC | PRN

## 2023-06-01 MED ORDER — FLEET ENEMA RE ENEM: 1.0000 | ENEMA | Freq: Once | RECTAL | Status: DC | PRN

## 2023-06-01 MED ORDER — ACETAMINOPHEN 500 MG PO TABS: 1000.0000 mg | ORAL_TABLET | Freq: Once | ORAL | Status: DC

## 2023-06-01 MED ORDER — PROPOFOL 10 MG/ML IV BOLUS
INTRAVENOUS | Status: AC
  Filled 2023-06-01: qty 20

## 2023-06-01 MED ORDER — GABAPENTIN 300 MG PO CAPS
300.0000 mg | ORAL_CAPSULE | Freq: Two times a day (BID) | ORAL | Status: DC
  Administered 2023-06-01 – 2023-06-02 (×2): 300 mg via ORAL
  Filled 2023-06-01 (×2): qty 1

## 2023-06-01 MED ORDER — PHENYLEPHRINE 80 MCG/ML (10ML) SYRINGE FOR IV PUSH (FOR BLOOD PRESSURE SUPPORT)
PREFILLED_SYRINGE | INTRAVENOUS | Status: AC
  Filled 2023-06-01: qty 10

## 2023-06-01 MED ORDER — ONDANSETRON HCL 4 MG/2ML IJ SOLN
INTRAMUSCULAR | Status: AC
  Filled 2023-06-01: qty 2

## 2023-06-01 MED ORDER — THROMBIN 5000 UNITS EX SOLR: OROMUCOSAL | Status: DC | PRN

## 2023-06-01 MED ORDER — CEFAZOLIN SODIUM-DEXTROSE 2-4 GM/100ML-% IV SOLN
2.0000 g | INTRAVENOUS | Status: AC
  Administered 2023-06-01: 2 g via INTRAVENOUS
  Filled 2023-06-01: qty 100

## 2023-06-01 MED ORDER — SODIUM CHLORIDE 0.9% FLUSH
3.0000 mL | Freq: Two times a day (BID) | INTRAVENOUS | Status: DC
Start: 2023-06-01 — End: 2023-06-02
  Administered 2023-06-01 (×2): 3 mL via INTRAVENOUS

## 2023-06-01 MED ORDER — OXYCODONE HCL 5 MG PO TABS: 5.0000 mg | ORAL_TABLET | Freq: Once | ORAL | Status: DC | PRN

## 2023-06-01 MED ORDER — BUPIVACAINE-EPINEPHRINE (PF) 0.5% -1:200000 IJ SOLN
INTRAMUSCULAR | Status: DC | PRN
  Administered 2023-06-01: 5 mL

## 2023-06-01 MED ORDER — ONDANSETRON HCL 4 MG/2ML IJ SOLN
INTRAMUSCULAR | Status: DC | PRN
  Administered 2023-06-01: 4 mg via INTRAVENOUS

## 2023-06-01 MED ORDER — BUPIVACAINE-EPINEPHRINE (PF) 0.5% -1:200000 IJ SOLN
INTRAMUSCULAR | Status: AC
  Filled 2023-06-01: qty 30

## 2023-06-01 MED ORDER — LIDOCAINE 2% (20 MG/ML) 5 ML SYRINGE
INTRAMUSCULAR | Status: AC
  Filled 2023-06-01: qty 5

## 2023-06-01 MED ORDER — PROPOFOL 10 MG/ML IV BOLUS
INTRAVENOUS | Status: DC | PRN
  Administered 2023-06-01: 120 mg via INTRAVENOUS

## 2023-06-01 MED ORDER — ONDANSETRON HCL 4 MG/2ML IJ SOLN: 4.0000 mg | Freq: Once | INTRAMUSCULAR | Status: DC | PRN

## 2023-06-01 MED ORDER — HYDROCODONE-ACETAMINOPHEN 5-325 MG PO TABS: 2.0000 | ORAL_TABLET | Freq: Four times a day (QID) | ORAL | Status: DC | PRN

## 2023-06-01 MED ORDER — SUGAMMADEX SODIUM 200 MG/2ML IV SOLN
INTRAVENOUS | Status: DC | PRN
  Administered 2023-06-01: 200 mg via INTRAVENOUS

## 2023-06-01 MED ORDER — METHOCARBAMOL 1000 MG/10ML IJ SOLN: 500.0000 mg | Freq: Four times a day (QID) | INTRAMUSCULAR | Status: DC | PRN

## 2023-06-01 MED ORDER — KETOROLAC TROMETHAMINE 15 MG/ML IJ SOLN
7.5000 mg | Freq: Four times a day (QID) | INTRAMUSCULAR | Status: DC
Start: 2023-06-01 — End: 2023-06-02
  Administered 2023-06-01 – 2023-06-02 (×3): 7.5 mg via INTRAVENOUS
  Filled 2023-06-01 (×3): qty 1

## 2023-06-01 MED ORDER — ONDANSETRON HCL 4 MG/2ML IJ SOLN: 4.0000 mg | Freq: Four times a day (QID) | INTRAMUSCULAR | Status: DC | PRN

## 2023-06-01 MED ORDER — 0.9 % SODIUM CHLORIDE (POUR BTL) OPTIME
TOPICAL | Status: DC | PRN
  Administered 2023-06-01: 1000 mL

## 2023-06-01 MED ORDER — THROMBIN 5000 UNITS EX SOLR
CUTANEOUS | Status: AC
  Filled 2023-06-01: qty 5000

## 2023-06-01 MED ORDER — ROCURONIUM BROMIDE 10 MG/ML (PF) SYRINGE
PREFILLED_SYRINGE | INTRAVENOUS | Status: AC
  Filled 2023-06-01: qty 10

## 2023-06-01 MED ORDER — ROCURONIUM BROMIDE 10 MG/ML (PF) SYRINGE
PREFILLED_SYRINGE | INTRAVENOUS | Status: DC | PRN
  Administered 2023-06-01: 70 mg via INTRAVENOUS

## 2023-06-01 MED ORDER — ACETAMINOPHEN 325 MG PO TABS: 650.0000 mg | ORAL_TABLET | ORAL | Status: DC | PRN

## 2023-06-01 MED ORDER — SODIUM CHLORIDE 0.9% FLUSH: 3.0000 mL | INTRAVENOUS | Status: DC | PRN

## 2023-06-01 MED ORDER — FUROSEMIDE 40 MG PO TABS
40.0000 mg | ORAL_TABLET | Freq: Every day | ORAL | Status: DC
  Administered 2023-06-01 – 2023-06-02 (×2): 40 mg via ORAL
  Filled 2023-06-01 (×2): qty 1

## 2023-06-01 MED ORDER — METHOCARBAMOL 500 MG PO TABS
500.0000 mg | ORAL_TABLET | Freq: Four times a day (QID) | ORAL | Status: DC | PRN
  Administered 2023-06-02: 500 mg via ORAL
  Filled 2023-06-01: qty 1

## 2023-06-01 MED ORDER — ONDANSETRON HCL 4 MG PO TABS: 4.0000 mg | ORAL_TABLET | Freq: Four times a day (QID) | ORAL | Status: DC | PRN

## 2023-06-01 MED ORDER — BUPIVACAINE LIPOSOME 1.3 % IJ SUSP
INTRAMUSCULAR | Status: DC | PRN
  Administered 2023-06-01: 20 mL

## 2023-06-01 MED ORDER — ACETAMINOPHEN 500 MG PO TABS
1000.0000 mg | ORAL_TABLET | Freq: Four times a day (QID) | ORAL | Status: DC
  Administered 2023-06-01 – 2023-06-02 (×3): 1000 mg via ORAL
  Filled 2023-06-01 (×3): qty 2

## 2023-06-01 MED ORDER — HYDROMORPHONE HCL 1 MG/ML IJ SOLN: 0.2500 mg | INTRAMUSCULAR | Status: DC | PRN

## 2023-06-01 MED ORDER — MENTHOL 3 MG MT LOZG: 1.0000 | LOZENGE | OROMUCOSAL | Status: DC | PRN

## 2023-06-01 MED ORDER — AMLODIPINE BESYLATE 5 MG PO TABS
5.0000 mg | ORAL_TABLET | Freq: Every day | ORAL | Status: DC
  Administered 2023-06-01 – 2023-06-02 (×2): 5 mg via ORAL
  Filled 2023-06-01 (×2): qty 1

## 2023-06-01 SURGICAL SUPPLY — 54 items
BAG COUNTER SPONGE SURGICOUNT (BAG) ×1 IMPLANT
BUR CARBIDE MATCH 3.0 (BURR) ×1 IMPLANT
CNTNR URN SCR LID CUP LEK RST (MISCELLANEOUS) ×1 IMPLANT
CONT SPEC 4OZ STRL OR WHT (MISCELLANEOUS) ×1
COVER MAYO STAND STRL (DRAPES) ×1 IMPLANT
DRAIN JACKSON RD 7FR 3/32 (WOUND CARE) IMPLANT
DRAPE C-ARM 42X72 X-RAY (DRAPES) ×1 IMPLANT
DRAPE LAPAROTOMY 100X72X124 (DRAPES) ×1 IMPLANT
DRAPE MICROSCOPE SLANT 54X150 (MISCELLANEOUS) ×1 IMPLANT
DRAPE SURG 17X23 STRL (DRAPES) ×1 IMPLANT
DRSG OPSITE 4X5.5 SM (GAUZE/BANDAGES/DRESSINGS) IMPLANT
DRSG OPSITE POSTOP 4X6 (GAUZE/BANDAGES/DRESSINGS) IMPLANT
DRSG TELFA 3X8 NADH STRL (GAUZE/BANDAGES/DRESSINGS) IMPLANT
DURAPREP 26ML APPLICATOR (WOUND CARE) ×1 IMPLANT
ELECT BLADE INSULATED 4IN (ELECTROSURGICAL) ×1
ELECT COATED BLADE 2.86 ST (ELECTRODE) ×1 IMPLANT
ELECT REM PT RETURN 9FT ADLT (ELECTROSURGICAL) ×1
ELECTRODE BLADE INSULATED 4IN (ELECTROSURGICAL) ×1 IMPLANT
ELECTRODE REM PT RTRN 9FT ADLT (ELECTROSURGICAL) ×1 IMPLANT
EVACUATOR 1/8 PVC DRAIN (DRAIN) IMPLANT
GAUZE 4X4 16PLY ~~LOC~~+RFID DBL (SPONGE) IMPLANT
GAUZE SPONGE 4X4 12PLY STRL (GAUZE/BANDAGES/DRESSINGS) ×1 IMPLANT
GLOVE BIO SURGEON STRL SZ7 (GLOVE) ×1 IMPLANT
GLOVE BIOGEL PI IND STRL 7.5 (GLOVE) ×1 IMPLANT
GLOVE BIOGEL PI IND STRL 8 (GLOVE) ×1 IMPLANT
GLOVE ECLIPSE 8.0 STRL XLNG CF (GLOVE) ×2 IMPLANT
GOWN STRL REUS W/ TWL LRG LVL3 (GOWN DISPOSABLE) IMPLANT
GOWN STRL REUS W/ TWL XL LVL3 (GOWN DISPOSABLE) ×2 IMPLANT
GOWN STRL REUS W/TWL 2XL LVL3 (GOWN DISPOSABLE) IMPLANT
GOWN STRL REUS W/TWL LRG LVL3 (GOWN DISPOSABLE)
GOWN STRL REUS W/TWL XL LVL3 (GOWN DISPOSABLE) ×2
HEMOSTAT POWDER KIT SURGIFOAM (HEMOSTASIS) ×1 IMPLANT
KIT BASIN OR (CUSTOM PROCEDURE TRAY) ×1 IMPLANT
KIT POSITION SURG JACKSON T1 (MISCELLANEOUS) ×1 IMPLANT
KIT TURNOVER KIT B (KITS) ×1 IMPLANT
MARKER SKIN DUAL TIP RULER LAB (MISCELLANEOUS) ×1 IMPLANT
NDL HYPO 25X1 1.5 SAFETY (NEEDLE) ×1 IMPLANT
NEEDLE HYPO 25X1 1.5 SAFETY (NEEDLE) ×1 IMPLANT
NS IRRIG 1000ML POUR BTL (IV SOLUTION) ×1 IMPLANT
PACK LAMINECTOMY NEURO (CUSTOM PROCEDURE TRAY) ×1 IMPLANT
PAD ARMBOARD 7.5X6 YLW CONV (MISCELLANEOUS) ×3 IMPLANT
PATTIES SURGICAL .5 X.5 (GAUZE/BANDAGES/DRESSINGS) IMPLANT
PATTIES SURGICAL .5 X1 (DISPOSABLE) IMPLANT
PATTIES SURGICAL 1X1 (DISPOSABLE) IMPLANT
SPIKE FLUID TRANSFER (MISCELLANEOUS) ×1 IMPLANT
SPONGE SURGIFOAM ABS GEL SZ50 (HEMOSTASIS) ×1 IMPLANT
SPONGE T-LAP 4X18 ~~LOC~~+RFID (SPONGE) IMPLANT
STAPLER VISISTAT 35W (STAPLE) IMPLANT
SUT VIC AB 0 CT1 18XCR BRD8 (SUTURE) ×1 IMPLANT
SUT VIC AB 2-0 CP2 18 (SUTURE) ×1 IMPLANT
SUT VIC AB 3-0 SH 8-18 (SUTURE) ×1 IMPLANT
TOWEL GREEN STERILE (TOWEL DISPOSABLE) ×1 IMPLANT
TOWEL GREEN STERILE FF (TOWEL DISPOSABLE) ×1 IMPLANT
WATER STERILE IRR 1000ML POUR (IV SOLUTION) ×1 IMPLANT

## 2023-06-01 NOTE — Anesthesia Postprocedure Evaluation (Signed)
Anesthesia Post Note  Patient: MATHESON GAVETTE  Procedure(s) Performed: OPEN LUMBAR LAMINECTOMY LUMBAR TWO-THREE, LUMBAR THREE-FOUR, LUMBAR FOUR-FIVE (Spine Lumbar)     Patient location during evaluation: PACU Anesthesia Type: General Level of consciousness: awake and alert Pain management: pain level controlled Vital Signs Assessment: post-procedure vital signs reviewed and stable Respiratory status: spontaneous breathing, nonlabored ventilation, respiratory function stable and patient connected to nasal cannula oxygen Cardiovascular status: blood pressure returned to baseline and stable Postop Assessment: no apparent nausea or vomiting Anesthetic complications: no  No notable events documented.  Last Vitals:  Vitals:   06/01/23 1345 06/01/23 1400  BP: 132/60 124/61  Pulse: 97 93  Resp: 15 15  Temp:  36.7 C  SpO2: 94% 95%    Last Pain:  Vitals:   06/01/23 1345  PainSc: Asleep                 Lorisa Scheid S

## 2023-06-01 NOTE — Transfer of Care (Signed)
Immediate Anesthesia Transfer of Care Note  Patient: Austin Ayala  Procedure(s) Performed: OPEN LUMBAR LAMINECTOMY LUMBAR TWO-THREE, LUMBAR THREE-FOUR, LUMBAR FOUR-FIVE (Spine Lumbar)  Patient Location: PACU  Anesthesia Type:General  Level of Consciousness: drowsy  Airway & Oxygen Therapy: Patient Spontanous Breathing and Patient connected to face mask oxygen  Post-op Assessment: Report given to RN and Post -op Vital signs reviewed and stable  Post vital signs: Reviewed and stable  Last Vitals:  Vitals Value Taken Time  BP 144/64 06/01/23 1248  Temp 36.7 C 06/01/23 1248  Pulse 101 06/01/23 1251  Resp 16 06/01/23 1251  SpO2 98 % 06/01/23 1251  Vitals shown include unfiled device data.  Last Pain:  Vitals:   06/01/23 0744  PainSc: 4          Complications: No notable events documented.

## 2023-06-01 NOTE — Op Note (Signed)
Providing Compassionate, Quality Care - Together   Date of service: 06/01/2023   PREOP DIAGNOSIS:  L2-3, L3-4, L4-5 lumbar stenosis with neurogenic claudication   POSTOP DIAGNOSIS: Same   PROCEDURE: Open bilateral L2, L3, L4, L5 laminectomy for decompression neural elements; L2-3, L3-4, L4-5 medial facetectomies Intraoperative use of microscope for microdissection Intraoperative use of fluoroscopy   SURGEON: Dr. Kendell Bane C. Shermika Balthaser, DO   ASSISTANT: Patrici Ranks, PA; Lisbeth Renshaw, MD   ANESTHESIA: General Endotracheal   EBL: 200 cc   SPECIMENS: None   DRAINS: Medium HMV   COMPLICATIONS: None   CONDITION: Hemodynamically stable   HISTORY: Austin Ayala is a 88 y.o. male with bilateral lower extremity neurogenic claudication, with progressive worsening difficulty ambulating, failed multiple conservative measures including epidural injections.  MRI revealed multifactorial severe stenosis at L2-L5.  I offered him surgical intervention in the form of an open laminectomy L2-5, with medial facetectomies.  Informed consent was obtained and witnessed.  All risks, benefits and expected outcomes were discussed and agreed upon.   PROCEDURE IN DETAIL: The patient was brought to the operating room. After induction of general anesthesia, the patient was positioned on the operative table in the prone position. All pressure points were meticulously padded. Skin incision was then marked out and prepped and draped in the usual sterile fashion.   Using a 10 blade, midline incision was created over the L2-L5 spinous processes.  Using Bovie electrocautery soft tissue dissection was performed down to the lumbodorsal fascia.  Subperiosteal dissection was performed bilaterally with Bovie electrocautery exposing the L2, L3, L4, L5 lamina bilaterally.  Deep retractors placed in the wound.  Lateral fluoroscopy confirmed the appropriate level.  The microscope was sterilely draped and brought into the  field.  Using Leksell rongeur, the spinous process of L 2, 3, 4, 5 were removed down to the lamina.   Using a high-speed drill, the L4 lamina was removed bilaterally to the lateral recess down to the ligamentum flavum and superiorly up to the ligamentous attachment bilaterally.  Using high-speed drill, a partial bilateral facetectomy was performed down to the ligamentum flavum.  The superior portion of the L5 lamina was then removed to the epidural space with the high speed drill. The ligamentum flavum was then gently dissected from the epidural space and resected with a series of Kerrison rongeurs to the lateral recess bilaterally.  Using a ball-tipped probe, bilateral lateral recesses appeared decompressed.  The bilateral lateral recess were explored again with a ball-tipped probe and noted to be appropriately decompressed.  The thecal sac was pulsatile.  Bilateral foramen were also palpated with a ball-tipped probe and noted to be adequately decompressed.  Epidural hemostasis was achieved with Surgifoam.  Using a high-speed drill, the L3 lamina was removed bilaterally to the lateral recess down to the ligamentum flavum and superiorly up to the ligamentous attachment bilaterally.  Using high-speed drill, a partial bilateral facetectomy was performed down to the ligamentum flavum.  The superior portion of the L4 lamina was then removed to the epidural space with the high speed drill. The ligamentum flavum was then gently dissected from the epidural space and resected with a series of Kerrison rongeurs to the lateral recess bilaterally.  Using a ball-tipped probe, bilateral lateral recesses appeared decompressed.  The bilateral lateral recess were explored again with a ball-tipped probe and noted to be appropriately decompressed.  The thecal sac was pulsatile.  Bilateral foramen were also palpated with a ball-tipped probe and noted to be adequately  decompressed.  Epidural hemostasis was achieved with  Surgifoam.  Using a high-speed drill, the L2 lamina was removed bilaterally to the lateral recess down to the ligamentum flavum and superiorly up to the ligamentous attachment bilaterally.  Using high-speed drill, a partial bilateral facetectomy was performed down to the ligamentum flavum.  The superior portion of the L3 lamina was then removed to the epidural space with the high speed drill. The ligamentum flavum was then gently dissected from the epidural space and resected with a series of Kerrison rongeurs to the lateral recess bilaterally.  Using a ball-tipped probe, bilateral lateral recesses appeared decompressed.  The bilateral lateral recess were explored again with a ball-tipped probe and noted to be appropriately decompressed.  The thecal sac was pulsatile.  Bilateral foramen were also palpated with a ball-tipped probe and noted to be adequately decompressed.  Epidural hemostasis was achieved with Surgifoam.  A mixture of Depo-Medrol and fentanyl was placed in the epidural space with Avitene. Deep retractor was taken out of the wound.  Hemostasis was achieved with bipolar cautery and the soft tissues.  Medium Hemovac was tunneled laterally and placed in the subfascial space.  The wound was closed in layers with 0 Vicryl sutures for muscle and fascia.  Dermis was closed with 2-0 and 3-0 Vicryl sutures.  Skin was closed with skin glue.  Sterile dressing was applied.   At the end of the case all sponge, needle, and instrument counts were correct. The patient was then transferred to the stretcher, extubated, and taken to the post-anesthesia care unit in stable hemodynamic condition.

## 2023-06-01 NOTE — H&P (Signed)
Providing Compassionate, Quality Care - Together  NEUROSURGERY HISTORY & PHYSICAL   Austin Ayala is an 88 y.o. male.   Chief Complaint: Bilateral lower extremity neurogenic claudication HPI: This is an 88 year old male with progressively worsening complaints of bilateral lower extremity numbness, weakness and difficulty walking short distances.  He obtained relief with sitting or leaning forward.  Workup revealed multifactorial moderate to severe canal stenosis L2-5, he failed multiple conservative measures including epidural injections and therefore presents today for surgical intervention in the form of an L2-5 lumbar laminectomy.  Clearance was obtained.  Past Medical History:  Diagnosis Date   Benign neoplasm of colon 07/24/2015   Formatting of this note might be different from the original. Colon 2014. Stopping.   Closed wedge compression fracture of L3 vertebra with routine healing 05/24/2019   Formatting of this note might be different from the original. Per pt message 05/24/19   Elevated prostate specific antigen (PSA)    Gout, arthropathy    H/O carpal tunnel syndrome    H/O herpes zoster    H/O: hypertension    Heart murmur    pt had ECHO 04/23/23   Hypertension    Macular degeneration    Malignant neoplasm prostate (HCC) 07/24/2015   Formatting of this note might be different from the original. Dr. Saddie Benders managing.  Twice a year.   Mixed hyperlipidemia 09/28/2019   Onychomycosis 07/24/2015   Osteoarthritis 07/24/2015   Sleep apnea 07/24/2015   Formatting of this note might be different from the original. Infrequent CPAP   Ventral hernia without obstruction or gangrene 08/21/2016   Formatting of this note might be different from the original. Diastasis recti.    Past Surgical History:  Procedure Laterality Date   CATARACT EXTRACTION Bilateral    RADIOACTIVE SEED IMPLANT  2010   REPLACEMENT TOTAL KNEE BILATERAL     TONSILLECTOMY     as a child   TOTAL KNEE  REVISION Left 05/13/2020   Procedure: TOTAL KNEE REVISION;  Surgeon: Dannielle Huh, MD;  Location: [facility];  Service: Orthopedics;  Laterality: Left;    Family History  Problem Relation Age of Onset   Melanoma Brother    Heart disease Brother    Heart failure Mother    Hypertension Mother    Heart disease Brother    Melanoma Brother    Alzheimer's disease Brother    Social History:  reports that he has never smoked. He has never used smokeless tobacco. He reports that he does not drink alcohol and does not use drugs.  Allergies:  Allergies  Allergen Reactions   Doxycycline Hyclate     Dizziness (intolerance)    Medications Prior to Admission  Medication Sig Dispense Refill   acetaminophen (TYLENOL) 650 MG CR tablet Take 650 mg by mouth in the morning, at noon, in the evening, and at bedtime.     Alpha-Lipoic Acid 600 MG CAPS Take 600 mg by mouth daily.     amLODipine (NORVASC) 5 MG tablet Take 5 mg by mouth daily.     ascorbic acid (VITAMIN C) 500 MG tablet Take 500 mg by mouth daily.      BLACK PEPPER-TURMERIC PO Take 1 tablet by mouth daily. 5mg  black pepper, 500 mg of turmeric     cholecalciferol (VITAMIN D3) 25 MCG (1000 UNIT) tablet Take 1,000 Units by mouth daily.     Coenzyme Q10 (COQ-10) 100 MG CAPS Take 100 mg by mouth daily. With 180 mg of K2  Copper Gluconate (COPPER CAPS) 2 MG CAPS Take 2 mg by mouth at bedtime.      furosemide (LASIX) 40 MG tablet Take 1 tablet by mouth daily.     gabapentin (NEURONTIN) 300 MG capsule Take 300 mg by mouth 2 (two) times daily.     Ginkgo Biloba 120 MG CAPS Take 120 mg by mouth daily.     GRAPE SEED EXTRACT PO Take 380 mg by mouth daily.     HAWTHORN BERRIES PO Take 100 mg by mouth daily.     latanoprost (XALATAN) 0.005 % ophthalmic solution Place 1 drop into both eyes at bedtime.     LYCOPENE PO Take 40 mg by mouth daily.     Multiple Vitamin (MULTIVITAMIN) capsule Take 1 capsule by mouth daily.     Multiple Vitamins-Minerals  (PRESERVISION AREDS 2 PO) Take 1 capsule by mouth in the morning and at bedtime.     olmesartan (BENICAR) 40 MG tablet Take 40 mg by mouth daily.     OVER THE COUNTER MEDICATION Take 1 tablet by mouth daily. Nitro-oxide otc supplement     OVER THE COUNTER MEDICATION Take 1 tablet by mouth at bedtime. CBD with melatonin     vitamin B-12 (CYANOCOBALAMIN) 500 MCG tablet Take 500 mcg by mouth daily.     vitamin E 180 MG (400 UNITS) capsule Take 400 Units by mouth daily.      DM-Phenylephrine-Acetaminophen (COLD/FLU DAYTIME RELIEF PO) Take 1 capsule by mouth daily as needed (congsetion).      No results found for this or any previous visit (from the past 48 hour(s)). No results found.  ROS All pertinent positives and negatives are listed in HPI above  Blood pressure (!) 150/61, pulse (!) 57, temperature 98.1 F (36.7 C), resp. rate 18, height 5\' 11"  (1.803 m), weight 102.1 kg, SpO2 93%. Physical Exam  Awake alert oriented x 3, no acute distress Nonlabored breathing PERRLA Cranial nerves II through XII intact Full strength throughout upper extremities Bilateral lower extremities 4+/5 throughout Sensory intact to light touch   Assessment/Plan 88 year old male with  L2-5 multifactorial lumbar stenosis with neurogenic claudication  -He has failed multiple conservative measures including epidural injections therefore he presents today for surgical intervention in the form of an L2-5 open lumbar laminectomies.  We discussed all risks, benefits and expected outcomes as well as alternatives to treatment.  Cardiac clearance was obtained.  Informed consent was obtained and witnessed.    Thank you for allowing me to participate in this patient's care.  Please do not hesitate to call with questions or concerns.   Monia Pouch, DO Neurosurgeon [facility] [phone]

## 2023-06-01 NOTE — Anesthesia Preprocedure Evaluation (Signed)
Anesthesia Evaluation  Patient identified by MRN, date of birth, ID band Patient awake    Reviewed: Allergy & Precautions, H&P , NPO status , Patient's Chart, lab work & pertinent test results  Airway Mallampati: II  TM Distance: >3 FB Neck ROM: Limited    Dental no notable dental hx.    Pulmonary sleep apnea    Pulmonary exam normal breath sounds clear to auscultation       Cardiovascular hypertension, Normal cardiovascular exam Rhythm:Regular Rate:Normal  1. Left ventricular ejection fraction, by estimation, is 60 to 65%. The  left ventricle has normal function. The left ventricle has no regional  wall motion abnormalities. Left ventricular diastolic parameters are  consistent with Grade II diastolic  dysfunction (pseudonormalization).   2. Right ventricular systolic function is normal. The right ventricular  size is normal.   3. Left atrial size was moderately dilated.   4. The mitral valve is normal in structure. No evidence of mitral valve  regurgitation. No evidence of mitral stenosis.   5. The aortic valve is normal in structure. Aortic valve regurgitation is  not visualized. Aortic valve sclerosis is present, with no evidence of  aortic valve stenosis.   6. The inferior vena cava is normal in size with greater than 50%  respiratory variability, suggesting right atrial pressure of 3 mmHg.      Neuro/Psych negative neurological ROS  negative psych ROS   GI/Hepatic negative GI ROS, Neg liver ROS,,,  Endo/Other  obesity  Renal/GU negative Renal ROS  negative genitourinary   Musculoskeletal negative musculoskeletal ROS (+)    Abdominal   Peds negative pediatric ROS (+)  Hematology negative hematology ROS (+)   Anesthesia Other Findings   Reproductive/Obstetrics negative OB ROS                             Anesthesia Physical Anesthesia Plan  ASA: 3  Anesthesia Plan: General    Post-op Pain Management: Tylenol PO (pre-op)*   Induction: Intravenous  PONV Risk Score and Plan: 2 and Ondansetron, Dexamethasone and Treatment may vary due to age or medical condition  Airway Management Planned: Oral ETT  Additional Equipment:   Intra-op Plan:   Post-operative Plan: Extubation in OR  Informed Consent: I have reviewed the patients History and Physical, chart, labs and discussed the procedure including the risks, benefits and alternatives for the proposed anesthesia with the patient or authorized representative who has indicated his/her understanding and acceptance.     Dental advisory given  Plan Discussed with: CRNA and Surgeon  Anesthesia Plan Comments:        Anesthesia Quick Evaluation

## 2023-06-01 NOTE — Anesthesia Procedure Notes (Signed)
Procedure Name: Intubation Date/Time: 06/01/2023 9:51 AM  Performed by: April Holding, CRNAPre-anesthesia Checklist: Patient identified, Emergency Drugs available, Suction available and Patient being monitored Patient Re-evaluated:Patient Re-evaluated prior to induction Oxygen Delivery Method: Circle System Utilized Preoxygenation: Pre-oxygenation with 100% oxygen Induction Type: IV induction Ventilation: Mask ventilation without difficulty and Oral airway inserted - appropriate to patient size Laryngoscope Size: Hyacinth Meeker and 2 Grade View: Grade I Tube type: Oral Tube size: 7.5 mm Number of attempts: 1 Airway Equipment and Method: Stylet and Oral airway Placement Confirmation: ETT inserted through vocal cords under direct vision, positive ETCO2 and breath sounds checked- equal and bilateral Tube secured with: Tape Dental Injury: Teeth and Oropharynx as per pre-operative assessment

## 2023-06-02 ENCOUNTER — Encounter (HOSPITAL_COMMUNITY): Payer: Self-pay | Admitting: Neurological Surgery

## 2023-06-02 DIAGNOSIS — M48062 Spinal stenosis, lumbar region with neurogenic claudication: Secondary | ICD-10-CM | POA: Diagnosis not present

## 2023-06-02 MED ORDER — HYDROCODONE-ACETAMINOPHEN 5-325 MG PO TABS: 1.0000 | ORAL_TABLET | Freq: Four times a day (QID) | ORAL | 0 refills | Status: AC | PRN

## 2023-06-02 MED ORDER — DOCUSATE SODIUM 100 MG PO CAPS: 100.0000 mg | ORAL_CAPSULE | Freq: Two times a day (BID) | ORAL | 0 refills | Status: AC

## 2023-06-02 MED ORDER — METHOCARBAMOL 500 MG PO TABS: 500.0000 mg | ORAL_TABLET | Freq: Four times a day (QID) | ORAL | 1 refills | Status: AC | PRN

## 2023-06-02 NOTE — Plan of Care (Signed)
Pt doing well. Pt given D/C instructions with verbal understanding. Rx's were sent to the pharmacy by MD. Pt's incision is clean and dry with no sign of infection. Pt's IV was removed prior to D/C. Pt D/C'd home via wheelchair per MD order. Pt is stable @ D/C and has no other needs at this time. Nihaal Friesen, RN  

## 2023-06-02 NOTE — Discharge Summary (Signed)
Patient ID: ADRIAN KUKUK MRN: [id number] DOB/AGE: Apr 26, 1935 88 y.o.  Admit date: 06/01/2023 Discharge date: 06/02/2023  Admission Diagnoses: Lumbar spinal stenosis [M48.061]   Discharge Diagnoses: same   Discharged Condition: Stable  Hospital Course:  Austin Ayala is a 88 y.o. male who was admitted following an uncomplicated lumbar laminectomy L2-3, 3-4, 4-5. They were recovered in PACU and transferred to [facility]. Hospital course was uncomplicated. Pt stable for discharge today. Pt to f/u in office for routine post op visit. Pt is in agreement w/ plan.    Discharge Exam: Blood pressure (!) 157/70, pulse 74, temperature 97.7 F (36.5 C), temperature source Oral, resp. rate 18, height 5\' 11"  (1.803 m), weight 102.1 kg, SpO2 99%. A&O x3 Speech fluent, appropriate Strength 5/5 x4.  SILTx4.  Dressing c/d/I.   Disposition: Discharge disposition: 01-Home or Self Care       Discharge Instructions     Incentive spirometry RT   Complete by: As directed       Allergies as of 06/02/2023       Reactions   Doxycycline Hyclate    Dizziness (intolerance)        Medication List     TAKE these medications    acetaminophen 650 MG CR tablet Commonly known as: TYLENOL Take 650 mg by mouth in the morning, at noon, in the evening, and at bedtime.   Alpha-Lipoic Acid 600 MG Caps Take 600 mg by mouth daily.   amLODipine 5 MG tablet Commonly known as: NORVASC Take 5 mg by mouth daily.   ascorbic acid 500 MG tablet Commonly known as: VITAMIN C Take 500 mg by mouth daily.   BLACK PEPPER-TURMERIC PO Take 1 tablet by mouth daily. 5mg  black pepper, 500 mg of turmeric   cholecalciferol 25 MCG (1000 UNIT) tablet Commonly known as: VITAMIN D3 Take 1,000 Units by mouth daily.   COLD/FLU DAYTIME RELIEF PO Take 1 capsule by mouth daily as needed (congsetion).   Copper Caps 2 MG Caps Generic drug: Copper Gluconate Take 2 mg by mouth at bedtime.   CoQ-10 100 MG Caps Take  100 mg by mouth daily. With 180 mg of K2   cyanocobalamin 500 MCG tablet Commonly known as: VITAMIN B12 Take 500 mcg by mouth daily.   docusate sodium 100 MG capsule Commonly known as: COLACE Take 1 capsule (100 mg total) by mouth 2 (two) times daily.   furosemide 40 MG tablet Commonly known as: LASIX Take 1 tablet by mouth daily.   gabapentin 300 MG capsule Commonly known as: NEURONTIN Take 300 mg by mouth 2 (two) times daily.   Ginkgo Biloba 120 MG Caps Take 120 mg by mouth daily.   GRAPE SEED EXTRACT PO Take 380 mg by mouth daily.   HAWTHORN BERRIES PO Take 100 mg by mouth daily.   HYDROcodone-acetaminophen 5-325 MG tablet Commonly known as: NORCO/VICODIN Take 1 tablet by mouth every 6 (six) hours as needed for moderate pain (pain score 4-6) ((score 4 to 6)).   latanoprost 0.005 % ophthalmic solution Commonly known as: XALATAN Place 1 drop into both eyes at bedtime.   LYCOPENE PO Take 40 mg by mouth daily.   methocarbamol 500 MG tablet Commonly known as: ROBAXIN Take 1 tablet (500 mg total) by mouth every 6 (six) hours as needed for muscle spasms.   multivitamin capsule Take 1 capsule by mouth daily.   olmesartan 40 MG tablet Commonly known as: BENICAR Take 40 mg by mouth daily.   OVER THE  COUNTER MEDICATION Take 1 tablet by mouth daily. Nitro-oxide otc supplement   OVER THE COUNTER MEDICATION Take 1 tablet by mouth at bedtime. CBD with melatonin   PRESERVISION AREDS 2 PO Take 1 capsule by mouth in the morning and at bedtime.   vitamin E 180 MG (400 UNITS) capsule Take 400 Units by mouth daily.         Signed: Elvera Maria Marilu Rylander 06/02/2023, 9:22 AM

## 2023-06-02 NOTE — Evaluation (Addendum)
Physical Therapy Evaluation Patient Details Name: Austin Ayala MRN: [id number] DOB: 1934/09/02 Today's Date: 06/02/2023  History of Present Illness  Austin Ayala is a 88 yo male who underwent L2, L3, L4, L5 laminectomy for decompression neural elements 11/19. PMHx: HTN, macular degeneration, HLD, OA, sleep apnea   Clinical Impression  Austin Ayala is a 88 y.o. male POD 1 s/p L2-5 laminectomy. Patient reports mod ind with RW for mobilty at baseline. Patient is now limited by functional impairments (see PT problem list below) and requires supervision for safety with transfers and gait with RW. Patient was able to ambulate ~150 feet with RW and close supervision and cues for safe walker management. Patient educated on safe sequencing for stair mobility with bil hand rails. Pt educated on post-operative spinal precautions, "BLT" and verbalized understanding. Patient will benefit from continued skilled PT interventions to address impairments and progress towards PLOF. Patient has met mobility goals at adequate level for discharge home; recommend follow up HHPT and support from family.            If plan is discharge home, recommend the following: A little help with walking and/or transfers;A little help with bathing/dressing/bathroom;Assistance with cooking/housework;Assist for transportation;Help with stairs or ramp for entrance   Can travel by private vehicle        Equipment Recommendations None recommended by PT  Recommendations for Other Services       Functional Status Assessment Patient has had a recent decline in their functional status and demonstrates the ability to make significant improvements in function in a reasonable and predictable amount of time.     Precautions / Restrictions Precautions Precautions: Back;Fall Precaution Booklet Issued: Yes (comment) Precaution Comments: BLT Required Braces or Orthoses:  (no brace needed) Restrictions Weight Bearing Restrictions: No       Mobility  Bed Mobility Overal bed mobility: Needs Assistance Bed Mobility: Rolling, Sit to Sidelying Rolling: Supervision       Sit to sidelying: Supervision General bed mobility comments: HOB slighlty elveated ~20*, cues required for log roll technique.    Transfers Overall transfer level: Needs assistance Equipment used: Rolling walker (2 wheels) Transfers: Sit to/from Stand Sit to Stand: Supervision           General transfer comment: cues for hand placement and BLT precautions with transfer. close sueprvision for safety.    Ambulation/Gait Ambulation/Gait assistance: Supervision Gait Distance (Feet): 150 Feet Assistive device: Rolling walker (2 wheels) Gait Pattern/deviations: Step-through pattern, Decreased step length - right, Decreased step length - left, Decreased stride length, Trunk flexed, Decreased dorsiflexion - right, Decreased dorsiflexion - left, Shuffle, Wide base of support Gait velocity: decr     General Gait Details: patient required cues intermittently for safe proximity to RW. trunk flexed as pt tends to advance walker too far anteriorly. no buckling or LOB thorughout.  Stairs Stairs: Yes Stairs assistance: Contact guard assist Stair Management: Two rails, Alternating pattern, Forwards, Backwards Number of Stairs: 11 General stair comments: cues for stairs with step to pattern and placing entire foot on step for safety. pt preferring alt step pattern. pt ascend forwards and descend wtih backwards pattern and bil UE support.  Wheelchair Mobility     Tilt Bed    Modified Rankin (Stroke Patients Only)       Balance Overall balance assessment: Needs assistance Sitting-balance support: Feet supported Sitting balance-Leahy Scale: Good     Standing balance support: During functional activity Standing balance-Leahy Scale: Fair  Pertinent Vitals/Pain Pain Assessment Pain Assessment:  0-10 Pain Score: 1  Pain Location: back Pain Descriptors / Indicators: Discomfort Pain Intervention(s): Limited activity within patient's tolerance, Monitored during session, Repositioned    Home Living Family/patient expects to be discharged to:: Private residence Living Arrangements: Spouse/significant other Available Help at Discharge: Family;Available 24 hours/day Type of Home: House Home Access: Ramped entrance;Stairs to enter Entrance Stairs-Rails: Can reach both Entrance Stairs-Number of Steps: 5+5 or ramped option Alternate Level Stairs-Number of Steps: flight to basement Home Layout: Multi-level;Able to live on main level with bedroom/bathroom Home Equipment: Rolling Walker (2 wheels);Rollator (4 wheels);Cane - single point      Prior Function Prior Level of Function : Independent/Modified Independent             Mobility Comments: Using AD due to back/leg pain ADLs Comments: Mod I, stopped riving recently     Extremity/Trunk Assessment   Upper Extremity Assessment Upper Extremity Assessment: Defer to OT evaluation    Lower Extremity Assessment Lower Extremity Assessment: Generalized weakness    Cervical / Trunk Assessment Cervical / Trunk Assessment: Back Surgery;Kyphotic  Communication   Communication Communication: No apparent difficulties  Cognition Arousal: Alert Behavior During Therapy: WFL for tasks assessed/performed Overall Cognitive Status: Within Functional Limits for tasks assessed                                 General Comments: needed repeated cues to maintain back precuations        General Comments      Exercises     Assessment/Plan    PT Assessment Patient needs continued PT services  PT Problem List Decreased strength;Decreased range of motion;Decreased activity tolerance;Decreased balance;Decreased mobility;Decreased knowledge of use of DME;Decreased safety awareness;Decreased knowledge of  precautions;Obesity       PT Treatment Interventions DME instruction;Gait training;Stair training;Functional mobility training;Therapeutic activities;Therapeutic exercise;Balance training;Neuromuscular re-education;Cognitive remediation;Patient/family education    PT Goals (Current goals can be found in the Care Plan section)  Acute Rehab PT Goals Patient Stated Goal: return home PT Goal Formulation: With patient Time For Goal Achievement: 06/09/23 Potential to Achieve Goals: Good    Frequency Min 1X/week     Co-evaluation               AM-PAC PT "6 Clicks" Mobility  Outcome Measure Help needed turning from your back to your side while in a flat bed without using bedrails?: A Little Help needed moving from lying on your back to sitting on the side of a flat bed without using bedrails?: A Little Help needed moving to and from a bed to a chair (including a wheelchair)?: A Little Help needed standing up from a chair using your arms (e.g., wheelchair or bedside chair)?: A Little Help needed to walk in hospital room?: A Little Help needed climbing 3-5 steps with a railing? : A Little 6 Click Score: 18    End of Session Equipment Utilized During Treatment: Gait belt Activity Tolerance: Patient tolerated treatment well Patient left: in bed;with call bell/phone within reach Nurse Communication: Mobility status PT Visit Diagnosis: Other abnormalities of gait and mobility (R26.89);Muscle weakness (generalized) (M62.81);Difficulty in walking, not elsewhere classified (R26.2)    Time: 1610-9604 PT Time Calculation (min) (ACUTE ONLY): 19 min   Charges:   PT Evaluation $PT Eval Low Complexity: 1 Low   PT General Charges $$ ACUTE PT VISIT: 1 Visit  Wynn Maudlin, DPT Acute Rehabilitation Services Office [phone]  06/02/23 1:04 PM

## 2023-06-02 NOTE — Evaluation (Signed)
Occupational Therapy Evaluation Patient Details Name: Austin Ayala MRN: [id number] DOB: [birth year] Today's Date: 06/02/2023   History of Present Illness Austin Ayala is a 88 yo male who underwent L2, L3, L4, L5 laminectomy for decompression neural elements 11/19. PMHx: HTN, macular degeneration, HLD, OA, sleep apnea   Clinical Impression   Austin Ayala was evaluated s/p the above spine surgery. He is mod I with AD at baseline. Upon evaluation pt was limited by surgical pain and spinal precautions. Overall he was able to mobilize with RW and complete UB ADLs without physical assist, he did need min A for socks/shoes due to back precautions. Provided cues and education on spinal precautions and compensatory techniques throughout, handout provided and pt demonstrated fair recall during ADLs and mobility needing cues throughout to maintain. Pt does not require further acute OT services. Recommend d/c home with support of family.         If plan is discharge home, recommend the following: A little help with bathing/dressing/bathroom;Assistance with cooking/housework;Assist for transportation    Functional Status Assessment  Patient has had a recent decline in their functional status and demonstrates the ability to make significant improvements in function in a reasonable and predictable amount of time.  Equipment Recommendations  None recommended by OT       Precautions / Restrictions Precautions Precautions: Back;Fall Precaution Booklet Issued: Yes (comment) Required Braces or Orthoses:  (no brace needed) Restrictions Weight Bearing Restrictions: No      Mobility Bed Mobility Overal bed mobility: Needs Assistance Bed Mobility: Rolling, Sidelying to Sit Rolling: Supervision Sidelying to sit: Supervision       General bed mobility comments: HOB elevated, cues for log roll    Transfers Overall transfer level: Needs assistance Equipment used: Rolling walker (2 wheels) Transfers:  Sit to/from Stand Sit to Stand: Contact guard assist                  Balance Overall balance assessment: Needs assistance Sitting-balance support: Feet supported Sitting balance-Leahy Scale: Good     Standing balance support: During functional activity Standing balance-Leahy Scale: Fair           ADL either performed or assessed with clinical judgement   ADL Overall ADL's : Needs assistance/impaired Eating/Feeding: Independent   Grooming: Supervision/safety   Upper Body Bathing: Set up;Sitting   Lower Body Bathing: Contact guard assist;Sit to/from stand   Upper Body Dressing : Set up;Sitting   Lower Body Dressing: Minimal assistance;Sit to/from stand Lower Body Dressing Details (indicate cue type and reason): min A for socks and shoes Toilet Transfer: Supervision/safety;Ambulation;Rolling walker (2 wheels)   Toileting- Clothing Manipulation and Hygiene: Supervision/safety       Functional mobility during ADLs: Supervision/safety;Rolling walker (2 wheels) General ADL Comments: cues throughout to maintain back precuations     Vision Baseline Vision/History: 0 No visual deficits Vision Assessment?: No apparent visual deficits     Perception Perception: Within Functional Limits       Praxis Praxis: WFL       Pertinent Vitals/Pain Pain Assessment Pain Assessment: 0-10 Pain Score: 1  Pain Location: back Pain Descriptors / Indicators: Discomfort Pain Intervention(s): Monitored during session, Limited activity within patient's tolerance     Extremity/Trunk Assessment Upper Extremity Assessment Upper Extremity Assessment: Generalized weakness   Lower Extremity Assessment Lower Extremity Assessment: Defer to PT evaluation   Cervical / Trunk Assessment Cervical / Trunk Assessment: Back Surgery   Communication Communication Communication: No apparent difficulties   Cognition Arousal: Alert  Behavior During Therapy: WFL for tasks  assessed/performed Overall Cognitive Status: Within Functional Limits for tasks assessed           General Comments: needed cues to maintain back precuations     General Comments  VSS     Home Living Family/patient expects to be discharged to:: Private residence Living Arrangements: Spouse/significant other Available Help at Discharge: Family;Available 24 hours/day Type of Home: House Home Access: Ramped entrance;Stairs to enter Entrance Stairs-Number of Steps: 5+5 or ramped option Entrance Stairs-Rails: Can reach both Home Layout: Multi-level;Able to live on main level with bedroom/bathroom Alternate Level Stairs-Number of Steps: flight to basement Alternate Level Stairs-Rails: Right;Left Bathroom Shower/Tub: Producer, television/film/video: Handicapped height     Home Equipment: Agricultural consultant (2 wheels);Rollator (4 wheels);Cane - single point          Prior Functioning/Environment Prior Level of Function : Independent/Modified Independent             Mobility Comments: Using AD due to back/leg pain ADLs Comments: Mod I, stopped riving recently        OT Problem List: Decreased range of motion;Decreased activity tolerance;Impaired balance (sitting and/or standing);Decreased knowledge of precautions;Decreased knowledge of use of DME or AE;Decreased safety awareness         OT Goals(Current goals can be found in the care plan section) Acute Rehab OT Goals Patient Stated Goal: home OT Goal Formulation: With patient Time For Goal Achievement: 06/02/23 Potential to Achieve Goals: Good   AM-PAC OT "6 Clicks" Daily Activity     Outcome Measure Help from another person eating meals?: None Help from another person taking care of personal grooming?: A Little Help from another person toileting, which includes using toliet, bedpan, or urinal?: A Little Help from another person bathing (including washing, rinsing, drying)?: A Little Help from another person to put  on and taking off regular upper body clothing?: A Little Help from another person to put on and taking off regular lower body clothing?: A Little 6 Click Score: 19   End of Session Equipment Utilized During Treatment: Rolling walker (2 wheels) Nurse Communication: Mobility status  Activity Tolerance: Patient tolerated treatment well Patient left: in bed;with call bell/phone within reach  OT Visit Diagnosis: Other abnormalities of gait and mobility (R26.89);Muscle weakness (generalized) (M62.81)                Time: 9629-5284 OT Time Calculation (min): 27 min Charges:  OT General Charges $OT Visit: 1 Visit OT Evaluation $OT Eval Moderate Complexity: 1 Mod OT Treatments $Self Care/Home Management : 8-22 mins  Derenda Mis, OTR/L Acute Rehabilitation Services Office [phone] Secure Chat Communication Preferred   Donia Pounds 06/02/2023, 10:39 AM

## 2024-05-29 ENCOUNTER — Other Ambulatory Visit: Payer: Self-pay | Admitting: Surgery
